# Patient Record
Sex: Female | Born: 1965 | Race: White | Hispanic: No | Smoking: Never smoker
Health system: Southern US, Community
[De-identification: ages and names within clinical notes are randomized; demographics above are authoritative.]

## PROBLEM LIST (undated history)

## (undated) DIAGNOSIS — K219 Gastro-esophageal reflux disease without esophagitis: Secondary | ICD-10-CM

## (undated) DIAGNOSIS — E079 Disorder of thyroid, unspecified: Secondary | ICD-10-CM

## (undated) DIAGNOSIS — F419 Anxiety disorder, unspecified: Secondary | ICD-10-CM

## (undated) DIAGNOSIS — I10 Essential (primary) hypertension: Secondary | ICD-10-CM

## (undated) HISTORY — PX: TONSILLECTOMY: SUR1361

## (undated) HISTORY — PX: ABDOMINAL HYSTERECTOMY: SHX81

---

## 2005-07-05 ENCOUNTER — Ambulatory Visit: Payer: Self-pay | Admitting: Internal Medicine

## 2005-08-24 ENCOUNTER — Ambulatory Visit: Payer: Self-pay | Admitting: Gastroenterology

## 2005-09-14 ENCOUNTER — Ambulatory Visit: Payer: Self-pay | Admitting: Gastroenterology

## 2006-02-01 ENCOUNTER — Ambulatory Visit: Payer: Self-pay | Admitting: Gastroenterology

## 2006-03-26 ENCOUNTER — Emergency Department: Payer: Self-pay | Admitting: Emergency Medicine

## 2006-06-21 ENCOUNTER — Ambulatory Visit: Payer: Self-pay | Admitting: Otolaryngology

## 2007-06-03 ENCOUNTER — Ambulatory Visit: Payer: Self-pay | Admitting: General Surgery

## 2008-07-09 ENCOUNTER — Ambulatory Visit: Payer: Self-pay | Admitting: Unknown Physician Specialty

## 2008-08-02 ENCOUNTER — Ambulatory Visit: Payer: Self-pay | Admitting: Unknown Physician Specialty

## 2008-08-12 ENCOUNTER — Ambulatory Visit: Payer: Self-pay | Admitting: Unknown Physician Specialty

## 2009-05-22 ENCOUNTER — Emergency Department: Payer: Self-pay | Admitting: Emergency Medicine

## 2009-06-03 ENCOUNTER — Ambulatory Visit: Payer: Self-pay | Admitting: General Surgery

## 2010-11-15 ENCOUNTER — Ambulatory Visit: Payer: Self-pay | Admitting: Internal Medicine

## 2012-09-16 ENCOUNTER — Ambulatory Visit: Payer: Self-pay | Admitting: Otolaryngology

## 2012-09-16 LAB — T4, FREE: Free Thyroxine: 1.07 ng/dL (ref 0.76–1.46)

## 2012-09-16 LAB — TSH: Thyroid Stimulating Horm: 3.95 u[IU]/mL

## 2012-09-23 ENCOUNTER — Ambulatory Visit: Payer: Self-pay | Admitting: Otolaryngology

## 2013-02-26 ENCOUNTER — Ambulatory Visit: Payer: Self-pay | Admitting: Otolaryngology

## 2013-03-04 ENCOUNTER — Observation Stay: Payer: Self-pay | Admitting: Otolaryngology

## 2013-03-04 LAB — COMPREHENSIVE METABOLIC PANEL
Albumin: 3.9 g/dL (ref 3.4–5.0)
Anion Gap: 7 (ref 7–16)
BUN: 15 mg/dL (ref 7–18)
Bilirubin,Total: 0.4 mg/dL (ref 0.2–1.0)
Calcium, Total: 9.2 mg/dL (ref 8.5–10.1)
Chloride: 102 mmol/L (ref 98–107)
Co2: 28 mmol/L (ref 21–32)
EGFR (African American): >60
Potassium: 3.4 mmol/L — ABNORMAL LOW (ref 3.5–5.1)
SGPT (ALT): 24 U/L (ref 12–78)
Sodium: 137 mmol/L (ref 136–145)
Total Protein: 8.3 g/dL — ABNORMAL HIGH (ref 6.4–8.2)

## 2013-03-04 LAB — CBC WITH DIFFERENTIAL/PLATELET
Basophil #: 0 10*3/uL (ref 0.0–0.1)
Basophil %: 0.3 %
Eosinophil %: 0.1 %
HCT: 43.4 % (ref 35.0–47.0)
Lymphocyte %: 30.9 %
MCH: 28.6 pg (ref 26.0–34.0)
MCHC: 33.5 g/dL (ref 32.0–36.0)
MCV: 86 fL (ref 80–100)
Monocyte #: 1.1 x10 3/mm — ABNORMAL HIGH (ref 0.2–0.9)
Monocyte %: 6.8 %
RBC: 5.07 10*6/uL (ref 3.80–5.20)
RDW: 12.9 % (ref 11.5–14.5)
WBC: 16.5 10*3/uL — ABNORMAL HIGH (ref 3.6–11.0)

## 2013-03-04 LAB — PROTIME-INR: Prothrombin Time: 13.3 secs (ref 11.5–14.7)

## 2015-04-01 NOTE — Discharge Summary (Signed)
Dates of Admission and Diagnosis:  Date of Admission 04-Mar-2013   Date of Discharge 04-Mar-2013   Admitting Diagnosis Post-op Tonsil Hemorrhage   Final Diagnosis Post-op Tonsil Hemorrhage   Discharge Diagnosis 1 Post-op Tonsil Hemorrhage    Chief Complaint/History of Present Illness 49 y.o. female s/p tonsillectomy and septoplasty 5 days ago presented to ED with significant emesis and bleeding from oral cavity.  Hgb in ED stable at >14 and coaguation normal.   Hepatic:  26-Mar-14 02:32   Bilirubin, Total 0.4  Alkaline Phosphatase 131  SGPT (ALT) 24  SGOT (AST)  13  Total Protein, Serum  8.3  Albumin, Serum 3.9  Routine BB:  26-Mar-14 02:32   ABO Group + Rh Type O Positive  Antibody Screen NEGATIVE (Result(s) reported on 04 Mar 2013 at 03:55AM.)  Routine Chem:  26-Mar-14 02:32   Glucose, Serum  134  BUN 15  Creatinine (comp) 1.04  Sodium, Serum 137  Potassium, Serum  3.4  Chloride, Serum 102  CO2, Serum 28  Calcium (Total), Serum 9.2  Osmolality (calc) 277  eGFR (African American) >60  eGFR (Non-African American) >60 (eGFR values <7m/min/1.73 m2 may be an indication of chronic kidney disease (CKD). Calculated eGFR is useful in patients with stable renal function. The eGFR calculation will not be reliable in acutely ill patients when serum creatinine is changing rapidly. It is not useful in  patients on dialysis. The eGFR calculation may not be applicable to patients at the low and high extremes of body sizes, pregnant women, and vegetarians.)  Anion Gap 7  Routine Coag:  26-Mar-14 02:32   Prothrombin 13.3  INR 1.0 (INR reference interval applies to patients on anticoagulant therapy. A single INR therapeutic range for coumarins is not optimal for all indications; however, the suggested range for most indications is 2.0 - 3.0. Exceptions to the INR Reference Range may include: Prosthetic heart valves, acute myocardial infarction, prevention of  myocardial infarction, and combinations of aspirin and anticoagulant. The need for a higher or lower target INR must be assessed individually. Reference: The Pharmacology and Management of the Vitamin K  antagonists: the seventh ACCP Conference on Antithrombotic and Thrombolytic Therapy. CYIRSW.5462Sept:126 (3suppl): [id number] A HCT value >55% may artifactually increase the PT.  In one study,  the increase was an average of 25%. Reference:  "Effect on Routine and Special Coagulation Testing Values of Citrate Anticoagulant Adjustment in Patients with High HCT Values." American Journal of Clinical Pathology 2006;126:400-405.)  Routine Hem:  26-Mar-14 02:32   WBC (CBC)  16.5  RBC (CBC) 5.07  Hemoglobin (CBC) 14.5  Hematocrit (CBC) 43.4  Platelet Count (CBC) 440  MCV 86  MCH 28.6  MCHC 33.5  RDW 12.9  Neutrophil % 61.9  Lymphocyte % 30.9  Monocyte % 6.8  Eosinophil % 0.1  Basophil % 0.3  Neutrophil #  10.2  Lymphocyte #  5.1  Monocyte #  1.1  Eosinophil # 0.0  Basophil # 0.0 (Result(s) [facility] following uneventful control of hemorrhage from right inferior tonsillar fossa.  Tolerated diet well.  Placed on Nucynta and tolerated it well with good pain control and no nausea/vomitting.  Ambulating to bathroom.  No bleeding.   Condition on Discharge Good   DISCHARGE INSTRUCTIONS HOME MEDS:  Medication Reconciliation: Patient's Home Medications at Discharge:     Medication Instructions  nasonex spray  1 spray(s)   as needed  zyrtec 10 mg oral tablet  1 tab(s)  once a day as needed     tylenol 559m  2 tab(s)  2 times a day as needed     omeprazole/magnesium    once a day   centrum vitamin    once a day   tapentadol 50 mg oral tablet  1 tab(s) orally every 4 hours, As needed, pain   promethazine  25 milligram(s) PO every 4 hours, As Needed, nausea, vomiting , As needed, nausea, vomiting     PRESCRIPTIONS: PRINTED AND PLACED ON CHART   Physician's Instructions:  Home Health? No   Treatments None   Home Oxygen? No   Diet Regular   Diet Consistency Mechanical Soft   Activity Limitations No exertional activity  No heavy lifting   Return to Work after follow up visit with MD   Time frame for Follow Up Appointment 1-2 weeks  Dr. JKathyrn Sheriffin 1 week   Electronic Signatures: Evelyn Aguinaldo, CShela Leff(MD)  (Signed 26-Mar-14 11:43)  Authored: ADMISSION DATE AND DIAGNOSIS, CHIEF COMPLAINT/HPI, [place], PATIENT INSTRUCTIONS   Last Updated: 26-Mar-14 11:43 by VPascal Lux(MD)

## 2015-04-01 NOTE — Op Note (Signed)
PATIENT NAME:  Suzanne Cox, Suzanne Cox MR#:  [id number] DATE OF BIRTH:  03-Jul-1966  DATE OF PROCEDURE:  03/04/2013  PREOPERATIVE DIAGNOSIS:  Postoperative tonsillar hemorrhage.   POSTOPERATIVE DIAGNOSES:  Postoperative tonsillar hemorrhage.   PROCEDURE PERFORMED:   1.  Controlled postoperative nasal hemorrhage.  2.  Nasal endoscopy.   SURGEON:  Kyung Rudd, M.D.   ANESTHESIA:  General endotracheal anesthesia.   ESTIMATED BLOOD LOSS:  3 mL.   IV FLUIDS:  Please see anesthesia record.   COMPLICATIONS:  None.   DRAINS AND STENT PLACEMENTS:  None.   SPECIMENS:  None.   INDICATIONS FOR PROCEDURE:  The patient is a 49 year old female 5 days status post a tonsillectomy as well as a septoplasty with an episode of profuse bleeding from her oral cavity.  I was called to evaluate in the Emergency Room.  She had a large blood clot at a right inferior pole.  She also is status post a septoplasty and had septal splints in place.  The patient was taken to the operating room for control of postoperative tonsillar hemorrhage as well as endoscopic nasal evaluation, possible cauterization.   OPERATIVE FINDINGS:  Right lower pole bleeding arterial vessel successfully cauterized, other small areas of friability.  The posterior oropharynx and tonsillar beds were cauterized.  Nasal endoscopy revealed the status post submucous resection and septoplasty, but no obvious signs of bleeding intranasally or in the nasopharynx.   DESCRIPTION OF PROCEDURE:  The patient was identified in holding.  The benefits and risks of procedure were discussed and consent was reviewed.  The patient was taken to the operating room and placed in the supine position.  General endotracheal anesthesia was induced.  The patient was rotated 45 degrees.  A shoulder roll was placed.  A McIvor mouth gag was inserted in patient's oral cavity and suspended from a Mayo stand.  The patient's oral cavity was copiously irrigated with sterile  saline.  This demonstrated a fibrinous exudate on her tonsils on the right side and tonsillar bed at the inferior pole on the right.  There was a blood clot and this was removed with Bovie suction cautery and there was brisk arterial bleeding.  At this point an Afrin-soaked pledgets was held in place for hemostasis and then Bovie suction cautery was applied to the inferior pole and the surrounding areas.  Manipulation of the other fibrinous exudate revealed some friability more at the superior aspect of the right pole and this area was cauterized.  There is a visible vein on the left side and this was cauterized as well.  At this time the patient's McIvor mouth gag was released from suspension and nasal endoscopy was performed.  A 0 degree endoscope was inserted into the patient's right nasal cavity and advanced.  A small blood clot was noted in the patient's inferior septum.  This was removed with a rhinologic suction.  There was a splint in place.  This was clipped and the splint was removed bilaterally after the suture was removed and the 0 degree endoscope was passed all the way to the posterior nasopharynx.  No evidence of any bleeding was admitted.  Attention was directed to patient's left side.  In a similar fashion the left side was evaluated endoscopically.  Again showed a submucous resection of a turbinate, but no evidence of active bleeding.  Small area of crusting was removed and visualization all the way back to the patient's nasopharynx was made with no issues.   At this time,  attention was directed again to the patient's oral cavity.  The mouth gag was placed into the patient's oral cavity and placed in suspension.  Visualization of the patient's tonsillar beds revealed a good hemostasis with no evidence of active bleeding and an OG tube was placed through the oral cavity site and through her oral cavity, passed down to the stomach and some trace amount of blood-tinged secretions were removed.  At  this time care of the patient was transferred to anesthesia and the patient tolerated the procedure well.      ____________________________ Kyung Ruddreighton C. Onia Shiflett, MD ccv:ea D: 03/04/2013 04:25:56 ET T: 03/04/2013 06:26:59 ET JOB#: [id number]  cc: Kyung Ruddreighton C. Mayzie Caughlin, MD, <Dictator> Kyung RuddREIGHTON C Ilya Neely MD ELECTRONICALLY SIGNED 03/06/2013 17:50

## 2015-09-29 ENCOUNTER — Emergency Department
Admission: EM | Admit: 2015-09-29 | Discharge: 2015-09-29 | Disposition: A | Discharge status: Home / Self Care | Payer: No Typology Code available for payment source | Attending: Emergency Medicine | Admitting: Emergency Medicine

## 2015-09-29 ENCOUNTER — Emergency Department: Payer: No Typology Code available for payment source

## 2015-09-29 DIAGNOSIS — R1011 Right upper quadrant pain: Secondary | ICD-10-CM

## 2015-09-29 DIAGNOSIS — G43809 Other migraine, not intractable, without status migrainosus: Secondary | ICD-10-CM | POA: Insufficient documentation

## 2015-09-29 DIAGNOSIS — M549 Dorsalgia, unspecified: Secondary | ICD-10-CM | POA: Insufficient documentation

## 2015-09-29 DIAGNOSIS — R1013 Epigastric pain: Secondary | ICD-10-CM | POA: Diagnosis not present

## 2015-09-29 DIAGNOSIS — K5900 Constipation, unspecified: Secondary | ICD-10-CM | POA: Insufficient documentation

## 2015-09-29 DIAGNOSIS — Z79899 Other long term (current) drug therapy: Secondary | ICD-10-CM | POA: Diagnosis not present

## 2015-09-29 DIAGNOSIS — R110 Nausea: Secondary | ICD-10-CM

## 2015-09-29 LAB — URINALYSIS COMPLETE WITH MICROSCOPIC (ARMC ONLY)
Bilirubin Urine: NEGATIVE
Glucose, UA: NEGATIVE mg/dL
Hgb urine dipstick: NEGATIVE
Ketones, ur: NEGATIVE mg/dL
LEUKOCYTES UA: NEGATIVE
Nitrite: NEGATIVE
PH: 7 (ref 5.0–8.0)
PROTEIN: NEGATIVE mg/dL
Specific Gravity, Urine: 1.002 — ABNORMAL LOW (ref 1.005–1.030)

## 2015-09-29 LAB — CBC
HEMATOCRIT: 41.8 % (ref 35.0–47.0)
Hemoglobin: 14.2 g/dL (ref 12.0–16.0)
MCH: 29.3 pg (ref 26.0–34.0)
MCHC: 34 g/dL (ref 32.0–36.0)
MCV: 86.3 fL (ref 80.0–100.0)
Platelets: 252 10*3/uL (ref 150–440)
RBC: 4.84 MIL/uL (ref 3.80–5.20)
RDW: 13 % (ref 11.5–14.5)
WBC: 8.7 10*3/uL (ref 3.6–11.0)

## 2015-09-29 LAB — COMPREHENSIVE METABOLIC PANEL
ALT: 24 U/L (ref 14–54)
AST: 16 U/L (ref 15–41)
Albumin: 4.1 g/dL (ref 3.5–5.0)
Alkaline Phosphatase: 108 U/L (ref 38–126)
Anion gap: 9 (ref 5–15)
BUN: 10 mg/dL (ref 6–20)
CHLORIDE: 104 mmol/L (ref 101–111)
CO2: 27 mmol/L (ref 22–32)
Calcium: 9.1 mg/dL (ref 8.9–10.3)
Creatinine, Ser: 0.87 mg/dL (ref 0.44–1.00)
GFR calc Af Amer: >60 mL/min (ref >60)
Glucose, Bld: 92 mg/dL (ref 65–99)
POTASSIUM: 3.5 mmol/L (ref 3.5–5.1)
SODIUM: 140 mmol/L (ref 135–145)
Total Bilirubin: 0.3 mg/dL (ref 0.3–1.2)
Total Protein: 7.5 g/dL (ref 6.5–8.1)

## 2015-09-29 LAB — LIPASE, BLOOD: LIPASE: 20 U/L (ref 11–51)

## 2015-09-29 MED ORDER — OXYCODONE-ACETAMINOPHEN 5-325 MG PO TABS: 1.0000 | ORAL_TABLET | Freq: Four times a day (QID) | ORAL | Status: AC | PRN

## 2015-09-29 MED ORDER — ONDANSETRON HCL 4 MG PO TABS: 4.0000 mg | ORAL_TABLET | Freq: Three times a day (TID) | ORAL | Status: AC | PRN

## 2015-09-29 MED ORDER — HYDROMORPHONE HCL 1 MG/ML IJ SOLN: 0.5000 mg | Freq: Once | INTRAMUSCULAR | Status: DC

## 2015-09-29 MED ORDER — SODIUM CHLORIDE 0.9 % IV BOLUS (SEPSIS)
1000.0000 mL | Freq: Once | INTRAVENOUS | Status: AC
  Administered 2015-09-29: 1000 mL via INTRAVENOUS

## 2015-09-29 MED ORDER — HYDROMORPHONE HCL 1 MG/ML IJ SOLN
1.0000 mg | Freq: Once | INTRAMUSCULAR | Status: AC
  Administered 2015-09-29: 1 mg via INTRAVENOUS
  Filled 2015-09-29: qty 1

## 2015-09-29 MED ORDER — FENTANYL CITRATE (PF) 100 MCG/2ML IJ SOLN: 25.0000 ug | Freq: Once | INTRAMUSCULAR | Status: DC

## 2015-09-29 MED ORDER — OMEPRAZOLE 40 MG PO CPDR: 40.0000 mg | DELAYED_RELEASE_CAPSULE | Freq: Every day | ORAL | Status: DC

## 2015-09-29 MED ORDER — ONDANSETRON HCL 4 MG/2ML IJ SOLN
4.0000 mg | Freq: Once | INTRAMUSCULAR | Status: AC
  Administered 2015-09-29: 4 mg via INTRAVENOUS
  Filled 2015-09-29: qty 2

## 2015-09-29 NOTE — ED Notes (Signed)
Pt to triage with reports of abd pain, back pain and headache x 3 days. Reports nausea without vomiting, denies diarrhea. Also reports some constipation but took enema today and had BM.

## 2015-09-29 NOTE — ED Provider Notes (Signed)
[facility] Emergency Department Provider Note  ____________________________________________  Time seen: Approximately 3:44 PM  I have reviewed the triage vital signs and the nursing notes.   HISTORY  Chief Complaint Abdominal Pain; Back Pain; and Headache    HPI Suzanne Cox is a 49 y.o. female with a family history of gallbladder disease presenting with epigastric and right upper quadrant pain, as well as migraine. The patient reports that 3 days ago she had a typical mild migraine. Yesterday she woke up and was also having a migraine but then developed epigastric and right upper quadrant "sharp" pains after eating. She did not eat any more for the rest the day. This morning she had breakfast, and the same pains recurred. She denies any fever, chills, diarrhea. She has had some constipation but has had 2 good bowel movements with laxatives today.She reports that at this time, her headache is "mild."   No past medical history on file.  There are no active problems to display for this patient.   No past surgical history on file.  Current Outpatient Rx  Name  Route  Sig  Dispense  Refill  . butalbital-acetaminophen-caffeine (FIORICET, ESGIC) 50-325-40 MG tablet   Oral   Take 1 tablet by mouth every 4 (four) hours as needed.      0   . Cetirizine HCl 10 MG CAPS   Oral   Take 1 tablet by mouth daily at 12 noon.         . escitalopram (LEXAPRO) 10 MG tablet   Oral   Take 1 tablet by mouth daily at 12 noon.      0   . NASONEX 50 MCG/ACT nasal spray   Nasal   Place 2 sprays into the nose daily as needed.      0     Dispense as written.   Marland Kitchen omeprazole (PRILOSEC) 40 MG capsule   Oral   Take 1 capsule (40 mg total) by mouth daily.   30 capsule   0   . ondansetron (ZOFRAN) 4 MG tablet   Oral   Take 1 tablet (4 mg total) by mouth every 8 (eight) hours as needed for nausea or vomiting.   15 tablet   0   . oxyCODONE-acetaminophen  (ROXICET) 5-325 MG tablet   Oral   Take 1 tablet by mouth every 6 (six) hours as needed.   6 tablet   0     Allergies Theophyllines; Topamax; Sulfa antibiotics; and Tetracyclines & related  No family history on file.  Social History Social History  Substance Use Topics  . Smoking status: Not on file  . Smokeless tobacco: Not on file  . Alcohol Use: Not on file    Review of Systems Constitutional: No fever/chills. No lightheadedness. No syncope. Eyes: No visual changes. ENT: No sore throat.  Cardiovascular: Denies chest pain, palpitations. Respiratory: Denies shortness of breath.  No cough. Gastrointestinal: Positive epigastric and right upper quadrant abdominal pain.  Positive nausea, no vomiting.  No diarrhea.  Positive constipation. Genitourinary: Negative for dysuria. No change in frequency. Musculoskeletal: Negative for back pain. Skin: Negative for rash. Neurological: Negative for headaches, focal weakness or numbness.  10-point ROS otherwise negative.  ____________________________________________   PHYSICAL EXAM:  VITAL SIGNS: ED Triage Vitals  Enc Vitals Group     BP 09/29/15 1530 153/91 mmHg     Pulse Rate 09/29/15 1530 88     Resp 09/29/15 1530 18     Temp 09/29/15  1530 98.4 F (36.9 C)     Temp Source 09/29/15 1530 Oral     SpO2 09/29/15 1530 96 %     Weight 09/29/15 1530 186 lb (84.369 kg)     Height 09/29/15 1530  (1.575 m)     Head Cir --      Peak Flow --      Pain Score 09/29/15 1530 5     Pain Loc --      Pain Edu? --      Excl. in GC? --     Constitutional: Alert and oriented. Well appearing and in no acute distress. Answer question appropriately. Eyes: Conjunctivae are normal.  EOMI. Head: Atraumatic. Nose: No congestion/rhinnorhea. Mouth/Throat: Mucous membranes are moist.  Neck: No stridor.  Supple.   Cardiovascular: Normal rate, regular rhythm. No murmurs, rubs or gallops.  Respiratory: Normal respiratory effort.  No  retractions. Lungs CTAB.  No wheezes, rales or ronchi. Gastrointestinal: Overweight, soft, nondistended. Tender to palpation in the right upper quadrant greater than epigastric areas with positive Murphy sign. No peritoneal signs, guarding or  rebound. Musculoskeletal: No LE edema.  Neurologic:  Normal speech and language. No gross focal neurologic deficits are appreciated.  Skin:  Skin is warm, dry and intact. No rash noted. Psychiatric: Mood and affect are normal. Speech and behavior are normal.  Normal judgement  ____________________________________________   LABS (all labs ordered are listed, but only abnormal results are displayed)  Labs Reviewed  LIPASE, BLOOD  COMPREHENSIVE METABOLIC PANEL  CBC  URINALYSIS COMPLETEWITH MICROSCOPIC ([facility] ONLY)   ____________________________________________  EKG  Not indicated ____________________________________________  RADIOLOGY  US Abdomen Limited Ruq  09/29/2015  CLINICAL DATA:  Right upper quadrant abdominal pain for 3 days with nausea. EXAM: US ABDOMEN LIMITED - RIGHT UPPER QUADRANT COMPARISON:  06/03/2007 abdominal pelvic CT. FINDINGS: Gallbladder: No gallstones or wall thickening visualized. No sonographic Murphy sign noted. Common bile duct: Diameter: Normal, 4 mm. Liver: No focal lesion identified. Within normal limits in parenchymal echogenicity. IMPRESSION: Normal right upper quadrant ultrasound.  No explanation for pain. Electronically Signed   By: Jeronimo Greaves M.D.   On: 09/29/2015 16:46    ____________________________________________   PROCEDURES  Procedure(s) performed: None  Critical Care performed: No ____________________________________________   INITIAL IMPRESSION / ASSESSMENT AND PLAN / ED COURSE  Pertinent labs & imaging results that were available during my care of the patient were reviewed by me and considered in my medical decision making (see chart for details).  49 y.o. female presenting with  intermittent epigastric and right upper quadrant sharp pains associated with nausea that are worse with eating. Given her exam, I am most concerned about gallbladder disease. At this time she has no symptoms that would be consistent with infection but we will check her white blood cell count and continue to monitor her temperature. I'll plan to send her for right upper quadrant ultrasound. Initiate symptomatically treatment.   ----------------------------------------- 4:16 PM on 09/29/2015 -----------------------------------------  The patient pain has improved and her nausea also has improved. I'm awaiting her ultrasound. She does have labs that are reassuring with a normal white count and LFTs which are not elevated.  ----------------------------------------- 5:05 PM on 09/29/2015 -----------------------------------------  The patient's symptoms have completely resolved, she is no longer having pain or nausea. Her ultrasound does not show gallbladder disease. Her labs are reassuring and her vital signs are stable. She does have a history of GERD and this may be an exacerbation of that so I  have increased her omeprazole from 20 mg daily to 40 mg daily. She will follow up with her primary care physician. She understands return precautions and follow-up instructions. ____________________________________________  FINAL CLINICAL IMPRESSION(S) / ED DIAGNOSES  Final diagnoses:  Right upper quadrant pain  Epigastric pain  Nausea  Other migraine without status migrainosus, not intractable      NEW MEDICATIONS STARTED DURING THIS VISIT:  New Prescriptions   OMEPRAZOLE (PRILOSEC) 40 MG CAPSULE    Take 1 capsule (40 mg total) by mouth daily.   ONDANSETRON (ZOFRAN) 4 MG TABLET    Take 1 tablet (4 mg total) by mouth every 8 (eight) hours as needed for nausea or vomiting.   OXYCODONE-ACETAMINOPHEN (ROXICET) 5-325 MG TABLET    Take 1 tablet by mouth every 6 (six) hours as needed.      Rockne MenghiniAnne-Caroline Santa Abdelrahman, MD 09/29/15 1706

## 2015-09-29 NOTE — Discharge Instructions (Signed)
Please drink plenty of fluid to stay well hydrated. You may take a clear liquid diet for the next 12-24 hours, then advance her diet as tolerated. He may take Zofran for nausea. He may take Motrin or Tylenol for mild to moderate pain, and Percocet for severe pain. Do not drive within 8 hours of taking Percocet.  Please return to the emergency department if he develops severe pain, fever, inability to keep down fluids, or any other symptoms concerning to you.  Abdominal Pain, Adult Many things can cause abdominal pain. Usually, abdominal pain is not caused by a disease and will improve without treatment. It can often be observed and treated at home. Your health care provider will do a physical exam and possibly order blood tests and X-rays to help determine the seriousness of your pain. However, in many cases, more time must pass before a clear cause of the pain can be found. Before that point, your health care provider may not know if you need more testing or further treatment. HOME CARE INSTRUCTIONS Monitor your abdominal pain for any changes. The following actions may help to alleviate any discomfort you are experiencing:  Only take over-the-counter or prescription medicines as directed by your health care provider.  Do not take laxatives unless directed to do so by your health care provider.  Try a clear liquid diet (broth, tea, or water) as directed by your health care provider. Slowly move to a bland diet as tolerated. SEEK MEDICAL CARE IF:  You have unexplained abdominal pain.  You have abdominal pain associated with nausea or diarrhea.  You have pain when you urinate or have a bowel movement.  You experience abdominal pain that wakes you in the night.  You have abdominal pain that is worsened or improved by eating food.  You have abdominal pain that is worsened with eating fatty foods.  You have a fever. SEEK IMMEDIATE MEDICAL CARE IF:  Your pain does not go away within 2  hours.  You keep throwing up (vomiting).  Your pain is felt only in portions of the abdomen, such as the right side or the left lower portion of the abdomen.  You pass bloody or black tarry stools. MAKE SURE YOU:  Understand these instructions.  Will watch your condition.  Will get help right away if you are not doing well or get worse.   This information is not intended to replace advice given to you by your health care provider. Make sure you discuss any questions you have with your health care provider.   Document Released: 09/05/2005 Document Revised: 08/17/2015 Document Reviewed: 08/05/2013 Elsevier Interactive Patient Education [employer]2016 [employer] Inc.

## 2015-09-30 ENCOUNTER — Ambulatory Visit
Admission: RE | Admit: 2015-09-30 | Discharge: 2015-09-30 | Disposition: A | Discharge status: Home / Self Care | Payer: No Typology Code available for payment source | Source: Ambulatory Visit | Attending: Family Medicine | Admitting: Family Medicine

## 2015-09-30 ENCOUNTER — Ambulatory Visit
Admission: RE | Admit: 2015-09-30 | Payer: No Typology Code available for payment source | Source: Ambulatory Visit | Admitting: *Deleted

## 2015-09-30 ENCOUNTER — Other Ambulatory Visit: Payer: Self-pay | Admitting: Family Medicine

## 2015-09-30 DIAGNOSIS — R1011 Right upper quadrant pain: Secondary | ICD-10-CM | POA: Insufficient documentation

## 2015-11-16 ENCOUNTER — Other Ambulatory Visit: Payer: Self-pay | Admitting: Internal Medicine

## 2015-11-16 DIAGNOSIS — E0590 Thyrotoxicosis, unspecified without thyrotoxic crisis or storm: Secondary | ICD-10-CM

## 2015-11-28 ENCOUNTER — Encounter
Admission: RE | Admit: 2015-11-28 | Discharge: 2015-11-28 | Disposition: A | Discharge status: Home / Self Care | Payer: No Typology Code available for payment source | Source: Ambulatory Visit | Attending: Internal Medicine | Admitting: Internal Medicine

## 2015-11-28 DIAGNOSIS — E0590 Thyrotoxicosis, unspecified without thyrotoxic crisis or storm: Secondary | ICD-10-CM | POA: Insufficient documentation

## 2015-11-28 MED ORDER — SODIUM IODIDE I-123 7.4 MBQ PO CAPS
159.6730 | ORAL_CAPSULE | Freq: Once | ORAL | Status: AC
  Administered 2015-11-28: 159.673 via ORAL
  Filled 2015-11-28: qty 1

## 2015-11-29 ENCOUNTER — Encounter
Admission: RE | Admit: 2015-11-29 | Discharge: 2015-11-29 | Disposition: A | Discharge status: Home / Self Care | Payer: No Typology Code available for payment source | Source: Ambulatory Visit | Attending: Internal Medicine | Admitting: Internal Medicine

## 2015-11-29 DIAGNOSIS — E0590 Thyrotoxicosis, unspecified without thyrotoxic crisis or storm: Secondary | ICD-10-CM | POA: Diagnosis not present

## 2017-08-03 ENCOUNTER — Encounter: Payer: Self-pay | Admitting: Emergency Medicine

## 2017-08-03 ENCOUNTER — Emergency Department
Admission: EM | Admit: 2017-08-03 | Discharge: 2017-08-03 | Disposition: A | Discharge status: Home / Self Care | Payer: No Typology Code available for payment source | Attending: Emergency Medicine | Admitting: Emergency Medicine

## 2017-08-03 ENCOUNTER — Emergency Department: Payer: No Typology Code available for payment source

## 2017-08-03 DIAGNOSIS — Z79899 Other long term (current) drug therapy: Secondary | ICD-10-CM | POA: Diagnosis not present

## 2017-08-03 DIAGNOSIS — K5733 Diverticulitis of large intestine without perforation or abscess with bleeding: Secondary | ICD-10-CM | POA: Diagnosis not present

## 2017-08-03 DIAGNOSIS — R109 Unspecified abdominal pain: Secondary | ICD-10-CM | POA: Diagnosis present

## 2017-08-03 DIAGNOSIS — K5792 Diverticulitis of intestine, part unspecified, without perforation or abscess without bleeding: Secondary | ICD-10-CM

## 2017-08-03 LAB — CBC
HEMATOCRIT: 41.3 % (ref 35.0–47.0)
HEMOGLOBIN: 14.2 g/dL (ref 12.0–16.0)
MCH: 29.6 pg (ref 26.0–34.0)
MCHC: 34.2 g/dL (ref 32.0–36.0)
MCV: 86.3 fL (ref 80.0–100.0)
Platelets: 264 10*3/uL (ref 150–440)
RBC: 4.79 MIL/uL (ref 3.80–5.20)
RDW: 12.7 % (ref 11.5–14.5)
WBC: 11.3 10*3/uL — ABNORMAL HIGH (ref 3.6–11.0)

## 2017-08-03 LAB — COMPREHENSIVE METABOLIC PANEL
ALBUMIN: 4.3 g/dL (ref 3.5–5.0)
ALT: 34 U/L (ref 14–54)
ANION GAP: 10 (ref 5–15)
AST: 21 U/L (ref 15–41)
Alkaline Phosphatase: 129 U/L — ABNORMAL HIGH (ref 38–126)
BUN: 17 mg/dL (ref 6–20)
CO2: 26 mmol/L (ref 22–32)
Calcium: 9.2 mg/dL (ref 8.9–10.3)
Chloride: 104 mmol/L (ref 101–111)
Creatinine, Ser: 0.93 mg/dL (ref 0.44–1.00)
GFR calc Af Amer: >60 mL/min (ref >60)
GFR calc non Af Amer: >60 mL/min (ref >60)
GLUCOSE: 132 mg/dL — AB (ref 65–99)
POTASSIUM: 3.4 mmol/L — AB (ref 3.5–5.1)
SODIUM: 140 mmol/L (ref 135–145)
Total Bilirubin: 0.5 mg/dL (ref 0.3–1.2)
Total Protein: 7.8 g/dL (ref 6.5–8.1)

## 2017-08-03 LAB — URINALYSIS, COMPLETE (UACMP) WITH MICROSCOPIC
Bilirubin Urine: NEGATIVE
Glucose, UA: NEGATIVE mg/dL
HGB URINE DIPSTICK: NEGATIVE
Ketones, ur: NEGATIVE mg/dL
Leukocytes, UA: NEGATIVE
Nitrite: NEGATIVE
PROTEIN: NEGATIVE mg/dL
RBC / HPF: NONE SEEN RBC/hpf (ref 0–5)
Specific Gravity, Urine: 1.009 (ref 1.005–1.030)
pH: 6 (ref 5.0–8.0)

## 2017-08-03 LAB — LIPASE, BLOOD: Lipase: 26 U/L (ref 11–51)

## 2017-08-03 MED ORDER — TRAMADOL HCL 50 MG PO TABS: 50.0000 mg | ORAL_TABLET | Freq: Four times a day (QID) | ORAL | 0 refills | Status: AC | PRN

## 2017-08-03 MED ORDER — CIPROFLOXACIN HCL 500 MG PO TABS: 500.0000 mg | ORAL_TABLET | Freq: Two times a day (BID) | ORAL | 0 refills | Status: AC

## 2017-08-03 MED ORDER — ONDANSETRON HCL 4 MG PO TABS: 4.0000 mg | ORAL_TABLET | Freq: Three times a day (TID) | ORAL | 0 refills | Status: DC | PRN

## 2017-08-03 MED ORDER — METRONIDAZOLE 500 MG PO TABS: 500.0000 mg | ORAL_TABLET | Freq: Three times a day (TID) | ORAL | 0 refills | Status: AC

## 2017-08-03 MED ORDER — IOPAMIDOL (ISOVUE-300) INJECTION 61%
100.0000 mL | Freq: Once | INTRAVENOUS | Status: AC | PRN
  Administered 2017-08-03: 100 mL via INTRAVENOUS

## 2017-08-03 MED ORDER — IOPAMIDOL (ISOVUE-300) INJECTION 61%
30.0000 mL | Freq: Once | INTRAVENOUS | Status: AC | PRN
  Administered 2017-08-03: 30 mL via ORAL

## 2017-08-03 NOTE — ED Triage Notes (Signed)
Pt to ed with c/o left sided abd pain acute onset this am. Denies n/v/d.

## 2017-08-03 NOTE — Discharge Instructions (Signed)
Please seek medical attention for any high fevers, chest pain, shortness of breath, change in behavior, persistent vomiting, bloody stool or any other new or concerning symptoms.  

## 2017-08-03 NOTE — ED Notes (Signed)
Patient has finished the oral contrast. CT scan aware.

## 2017-08-03 NOTE — ED Provider Notes (Signed)
[facility] Emergency Department Provider Note    ____________________________________________   I have reviewed the triage vital signs and the nursing notes.   HISTORY  Chief Complaint Abdominal Pain   History limited by: Not Limited   HPI Suzanne Cox is a 51 y.o. female who presents to the emergency department today because of concerns for abdominal pain. The pain started this morning. It was severe. It has been continuous since then. It is located in the lower abdomen primarily on the left side. She has had some difficulty with defecation with this pain. She denies any blood in her stool. No measured fevers.patient states it reminds her of when she has had ovarian cysts however she is undergone bilateral oophorectomy.    History reviewed. No pertinent past medical history.  There are no active problems to display for this patient.   History reviewed. No pertinent surgical history.  Prior to Admission medications   Medication Sig Start Date End Date Taking? Authorizing Provider  butalbital-acetaminophen-caffeine (FIORICET, ESGIC) 50-325-40 MG tablet Take 1 tablet by mouth every 4 (four) hours as needed. 07/04/15   [provider]  Cetirizine HCl 10 MG CAPS Take 1 tablet by mouth daily at 12 noon.    [provider]  escitalopram (LEXAPRO) 10 MG tablet Take 1 tablet by mouth daily at 12 noon. 09/16/15   [provider]  NASONEX 50 MCG/ACT nasal spray Place 2 sprays into the nose daily as needed. 07/07/15   [provider]  omeprazole (PRILOSEC) 40 MG capsule Take 1 capsule (40 mg total) by mouth daily. 09/29/15 09/28/16  Rockne Menghini, MD    Allergies Theophyllines; Topamax [topiramate]; Sulfa antibiotics; and Tetracyclines & related  History reviewed. No pertinent family history.  Social History Social History  Substance Use Topics  . Smoking status: Never Smoker  . Smokeless tobacco: Never Used  .  Alcohol use No    Review of Systems Constitutional: No fever/chills Eyes: No visual changes. ENT: No sore throat. Cardiovascular: Denies chest pain. Respiratory: Denies shortness of breath. Gastrointestinal: positive for lower abdominal pain Genitourinary: Negative for dysuria. Musculoskeletal: Negative for back pain. Skin: Negative for rash. Neurological: Negative for headaches, focal weakness or numbness.  ____________________________________________   PHYSICAL EXAM:  VITAL SIGNS: ED Triage Vitals  Enc Vitals Group     BP 08/03/17 1005 (!) 148/96     Pulse Rate 08/03/17 1005 97     Resp 08/03/17 1005 13     Temp 08/03/17 1005 98.3 F (36.8 C)     Temp Source 08/03/17 1005 Oral     SpO2 08/03/17 1005 98 %     Weight 08/03/17 1006 193 lb (87.5 kg)     Height 08/03/17 1006 5\' 1"  (1.549 m)     Head Circumference --      Peak Flow --      Pain Score 08/03/17 1015 8   Constitutional: Alert and oriented. Well appearing and in no distress. Eyes: Conjunctivae are normal.  ENT   Head: Normocephalic and atraumatic.   Nose: No congestion/rhinnorhea.   Mouth/Throat: Mucous membranes are moist.   Neck: No stridor. Hematological/Lymphatic/Immunilogical: No cervical lymphadenopathy. Cardiovascular: Normal rate, regular rhythm.  No murmurs, rubs, or gallops. Respiratory: Normal respiratory effort without tachypnea nor retractions. Breath sounds are clear and equal bilaterally. No wheezes/rales/rhonchi. Gastrointestinal: Soft and tender to palpation in the lower abdomen. No rebound. No guarding.  Genitourinary: Deferred Musculoskeletal: Normal range of motion in all extremities. No lower extremity edema.  Neurologic:  Normal speech and language. No gross focal neurologic deficits are appreciated.  Skin:  Skin is warm, dry and intact. No rash noted. Psychiatric: Mood and affect are normal. Speech and behavior are normal. Patient exhibits appropriate insight and  judgment.  ____________________________________________    LABS (pertinent positives/negatives)  Labs Reviewed  COMPREHENSIVE METABOLIC PANEL - Abnormal; Notable for the following:       Result Value   Potassium 3.4 (*)    Glucose, Bld 132 (*)    Alkaline Phosphatase 129 (*)    All other components within normal limits  CBC - Abnormal; Notable for the following:    WBC 11.3 (*)    All other components within normal limits  URINALYSIS, COMPLETE (UACMP) WITH MICROSCOPIC - Abnormal; Notable for the following:    Color, Urine STRAW (*)    APPearance CLEAR (*)    Bacteria, UA FEW (*)    Squamous Epithelial / LPF 0-5 (*)    All other components within normal limits  LIPASE, BLOOD     ____________________________________________   EKG  None  ____________________________________________    RADIOLOGY  CT abd/pel IMPRESSION:  1. Inflammation around the proximal sigmoid colon with diverticula  suggesting diverticulitis, without abscess. Consider follow-up to  exclude mucosal lesion.    ____________________________________________   PROCEDURES  Procedures  ____________________________________________   INITIAL IMPRESSION / ASSESSMENT AND PLAN / ED COURSE  Pertinent labs & imaging results that were available during my care of the patient were reviewed by me and considered in my medical decision making (see chart for details).  Patient presented to the emergency department today because of concerns for abdominal pain. CT scan is consistent with diverticulitis. Patient will be given a prescription for antibiotics, pain medication and nausea medication. ____________________________________________   FINAL CLINICAL IMPRESSION(S) / ED DIAGNOSES  Final diagnoses:  Diverticulitis     Note: This dictation was prepared with Dragon dictation. Any transcriptional errors that result from this process are unintentional     Phineas Semen, MD 08/03/17 1537

## 2018-05-12 IMAGING — CT CT ABD-PELV W/ CM
2 of 5 series · 16 of 46 positions shown, 18 images · IV contrast (APPLIED)
Comparison: 05/22/2009

CLINICAL DATA: LLQ pain x today into left flank, hx of hyster.,
appy and chole

EXAM:
CT ABDOMEN AND PELVIS WITH CONTRAST
TECHNIQUE: Multidetector CT imaging of the abdomen and pelvis was performed
using the standard protocol following bolus administration of
intravenous contrast.
CONTRAST:  100mL 2LI0FH-4LL IOPAMIDOL (2LI0FH-4LL) INJECTION 61%

[Series 2: routine abd/pel with · axial · 0.75mm/px · z∈[-483,-93]mm · 13 of 90 slices shown, 15 images]
[im 6/90  soft-tissue]
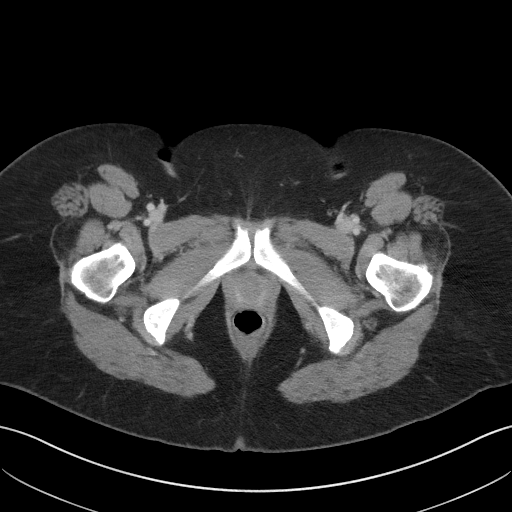
[im 6/90  bone]
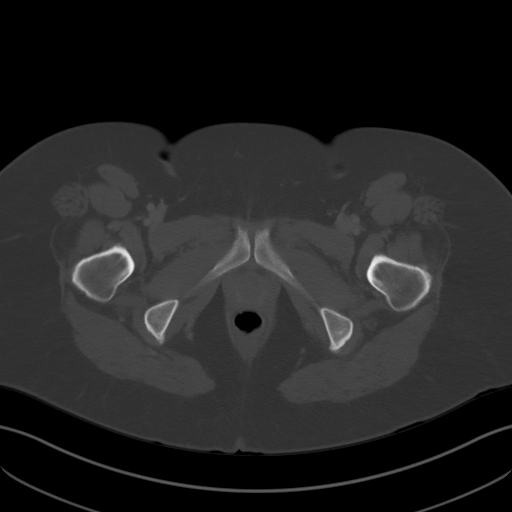
[im 12/90  soft-tissue]
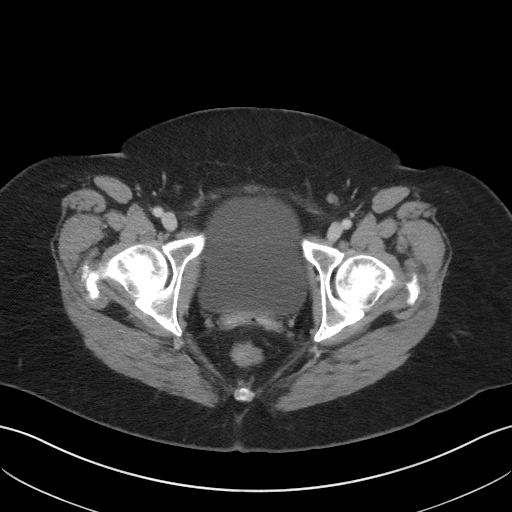
[im 17/90  soft-tissue]
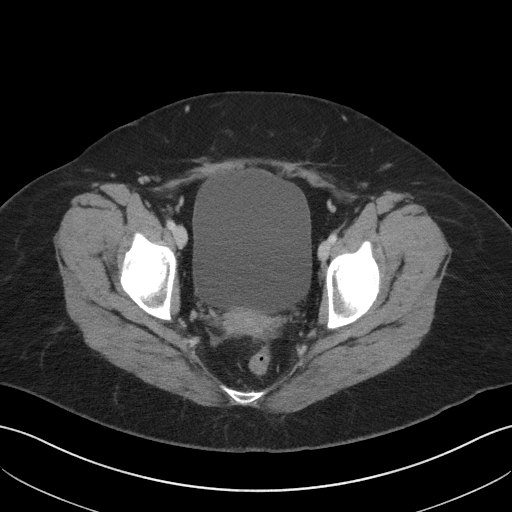
[im 28/90  soft-tissue]
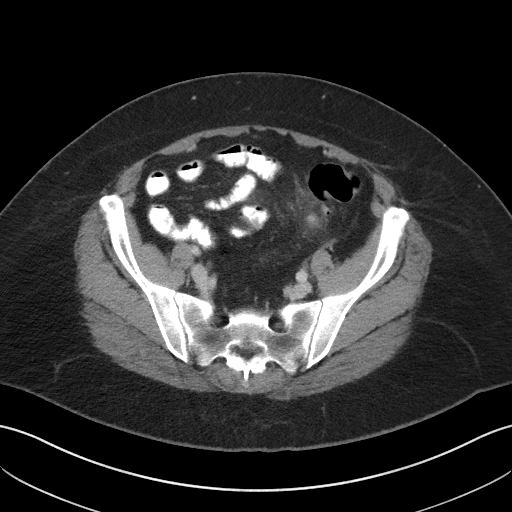
[im 34/90  soft-tissue]
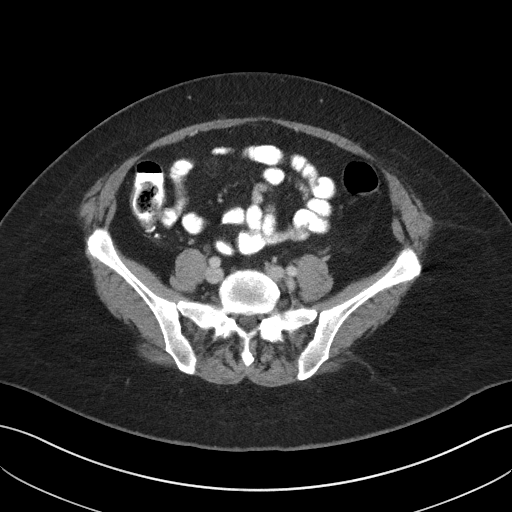
[im 39/90  soft-tissue]
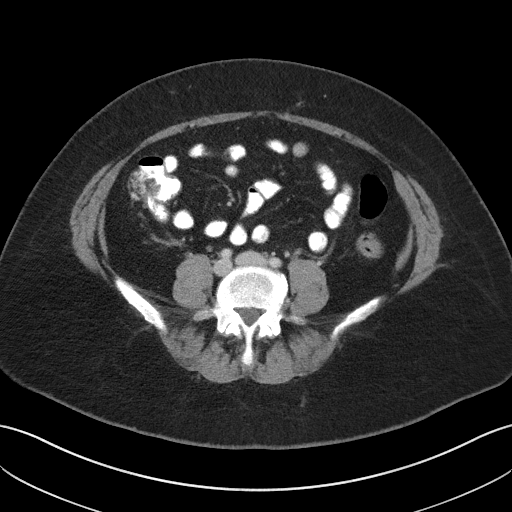
[im 45/90  soft-tissue]
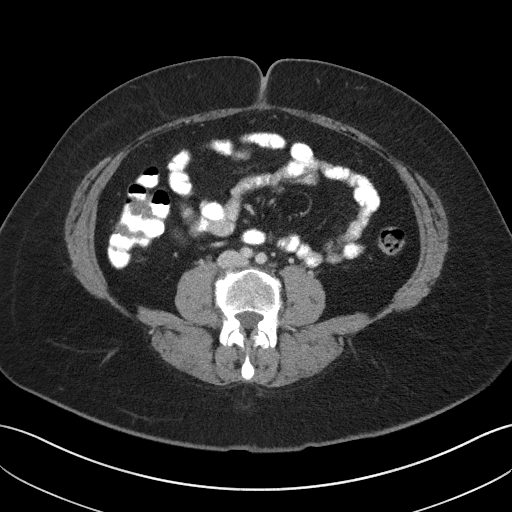
[im 51/90  soft-tissue]
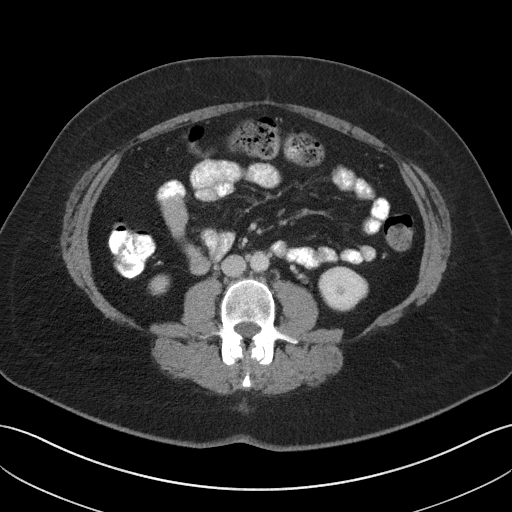
[im 56/90  soft-tissue]
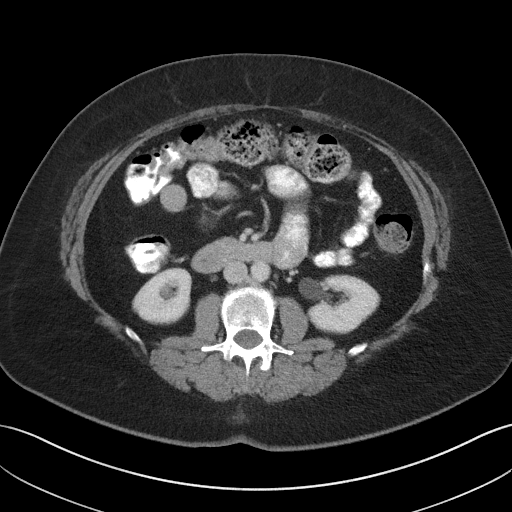
[im 56/90  bone]
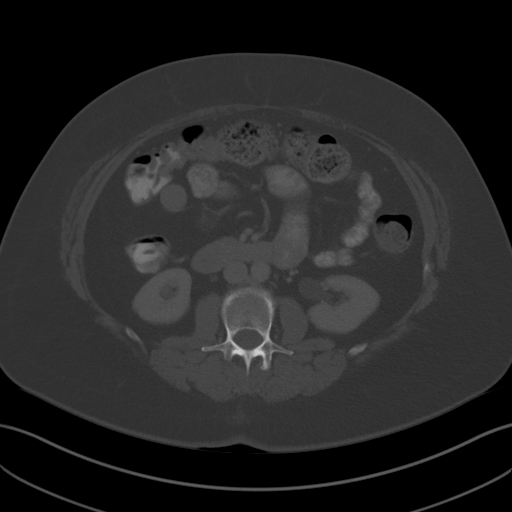
[im 62/90  soft-tissue]
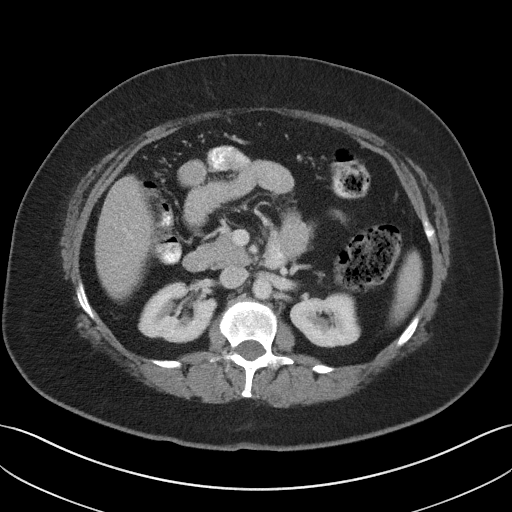
[im 73/90  soft-tissue]
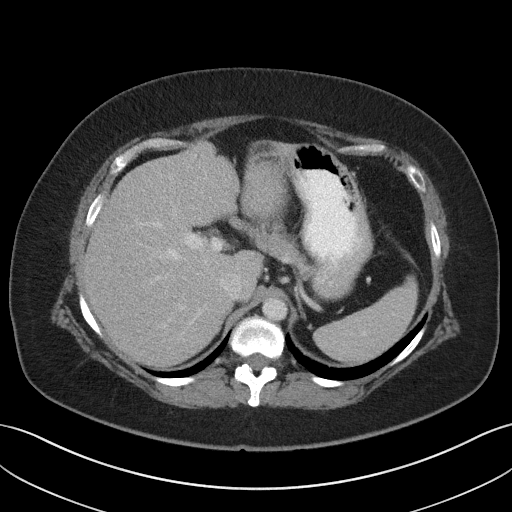
[im 78/90  soft-tissue]
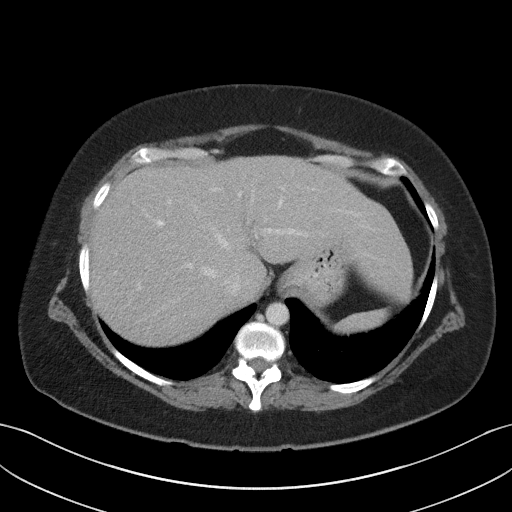
[im 84/90  soft-tissue]
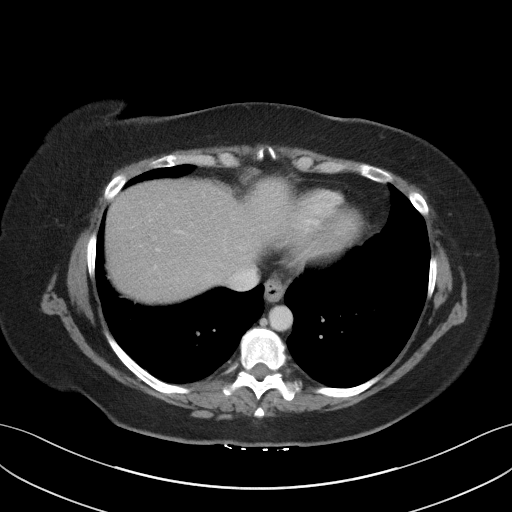

[Series 5: coronal st · coronal · 0.68mm/px · 3 of 97 slices shown]
[im 33/97  soft-tissue]
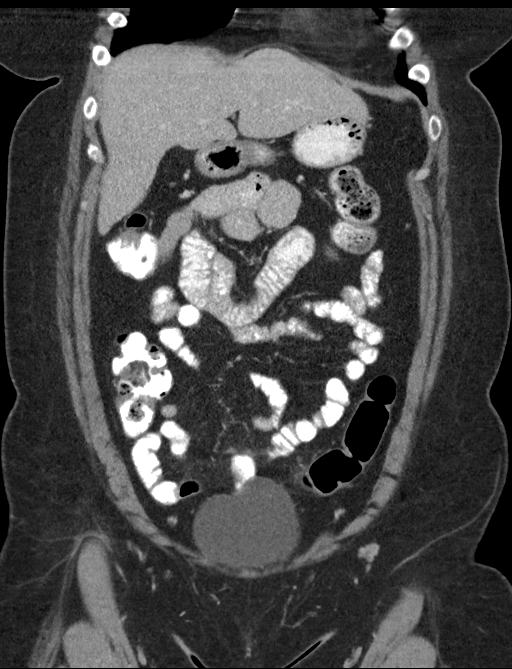
[im 43/97  soft-tissue]
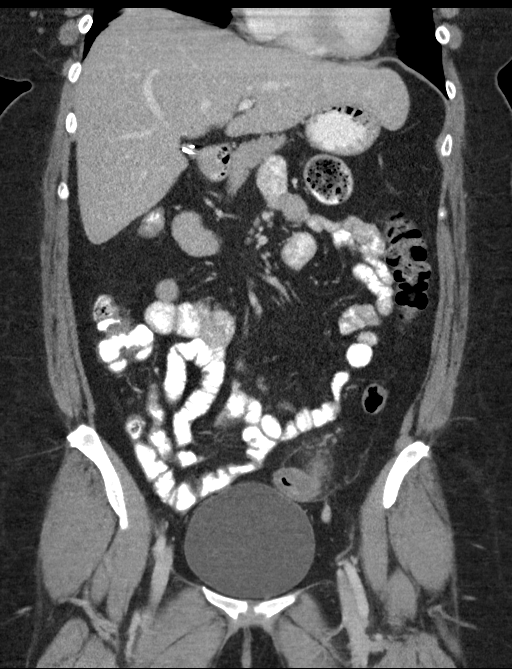
[im 54/97  soft-tissue]
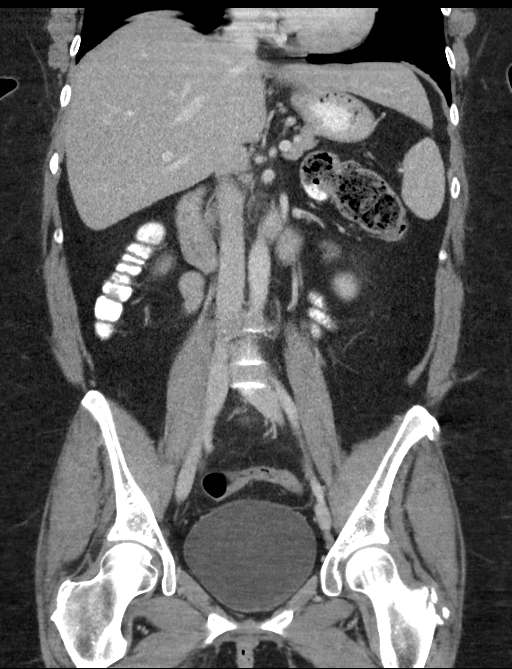

[16 of 46 positions shown; findings below may reference images not displayed]

FINDINGS: Lower chest: No acute abnormality.

Hepatobiliary: No focal liver abnormality is seen. Status post
cholecystectomy. No biliary dilatation.

Pancreas: Unremarkable. No pancreatic ductal dilatation or
surrounding inflammatory changes.

Spleen: Normal in size without focal abnormality.

Adrenals/Urinary Tract: Adrenal glands are unremarkable. Kidneys are
normal, without renal calculi, focal lesion, or hydronephrosis.
Bladder is physiologically distended, unremarkable.

Stomach/Bowel: Stomach is nondilated. Small bowel decompressed.
Appendix surgically absent. The colon is nondilated. A few distal
descending and sigmoid diverticula. Irregular wall thickening in the
proximal sigmoid colon with adjacent inflammatory/ edematous changes
and a single prominent diverticulum. No abscess or evident
perforation.

Vascular/Lymphatic: No significant vascular findings are present. No
enlarged abdominal or pelvic lymph nodes.

Reproductive: Status post hysterectomy. No adnexal masses.

Other: No ascites.  No free air.

Musculoskeletal: Negative for fracture or worrisome bone lesion.
IMPRESSION: 1. Inflammation around the proximal sigmoid colon with diverticula
suggesting diverticulitis, without abscess. Consider follow-up to
exclude mucosal lesion.

## 2020-12-28 ENCOUNTER — Emergency Department: Payer: BC Managed Care – PPO

## 2020-12-28 ENCOUNTER — Other Ambulatory Visit: Payer: Self-pay

## 2020-12-28 ENCOUNTER — Encounter: Payer: Self-pay | Admitting: Emergency Medicine

## 2020-12-28 DIAGNOSIS — U071 COVID-19: Principal | ICD-10-CM | POA: Diagnosis present

## 2020-12-28 DIAGNOSIS — Z882 Allergy status to sulfonamides status: Secondary | ICD-10-CM

## 2020-12-28 DIAGNOSIS — Z6835 Body mass index (BMI) 35.0-35.9, adult: Secondary | ICD-10-CM

## 2020-12-28 DIAGNOSIS — Z90710 Acquired absence of both cervix and uterus: Secondary | ICD-10-CM

## 2020-12-28 DIAGNOSIS — Z888 Allergy status to other drugs, medicaments and biological substances status: Secondary | ICD-10-CM

## 2020-12-28 DIAGNOSIS — J9601 Acute respiratory failure with hypoxia: Secondary | ICD-10-CM | POA: Diagnosis present

## 2020-12-28 DIAGNOSIS — Z881 Allergy status to other antibiotic agents status: Secondary | ICD-10-CM

## 2020-12-28 DIAGNOSIS — J1282 Pneumonia due to coronavirus disease 2019: Secondary | ICD-10-CM | POA: Diagnosis present

## 2020-12-28 DIAGNOSIS — E079 Disorder of thyroid, unspecified: Secondary | ICD-10-CM | POA: Diagnosis present

## 2020-12-28 DIAGNOSIS — I10 Essential (primary) hypertension: Secondary | ICD-10-CM | POA: Diagnosis present

## 2020-12-28 DIAGNOSIS — F32A Depression, unspecified: Secondary | ICD-10-CM | POA: Diagnosis present

## 2020-12-28 DIAGNOSIS — Z79899 Other long term (current) drug therapy: Secondary | ICD-10-CM

## 2020-12-28 DIAGNOSIS — E669 Obesity, unspecified: Secondary | ICD-10-CM | POA: Diagnosis present

## 2020-12-28 DIAGNOSIS — K219 Gastro-esophageal reflux disease without esophagitis: Secondary | ICD-10-CM | POA: Diagnosis present

## 2020-12-28 DIAGNOSIS — M549 Dorsalgia, unspecified: Secondary | ICD-10-CM | POA: Diagnosis present

## 2020-12-28 DIAGNOSIS — F419 Anxiety disorder, unspecified: Secondary | ICD-10-CM | POA: Diagnosis present

## 2020-12-28 LAB — BASIC METABOLIC PANEL
Anion gap: 10 (ref 5–15)
BUN: 13 mg/dL (ref 6–20)
CO2: 29 mmol/L (ref 22–32)
Calcium: 8.6 mg/dL — ABNORMAL LOW (ref 8.9–10.3)
Chloride: 99 mmol/L (ref 98–111)
Creatinine, Ser: 0.8 mg/dL (ref 0.44–1.00)
GFR, Estimated: >60 mL/min (ref >60)
Glucose, Bld: 103 mg/dL — ABNORMAL HIGH (ref 70–99)
Potassium: 4 mmol/L (ref 3.5–5.1)
Sodium: 138 mmol/L (ref 135–145)

## 2020-12-28 LAB — CBC
HCT: 42.1 % (ref 36.0–46.0)
Hemoglobin: 13.9 g/dL (ref 12.0–15.0)
MCH: 28.9 pg (ref 26.0–34.0)
MCHC: 33 g/dL (ref 30.0–36.0)
MCV: 87.5 fL (ref 80.0–100.0)
Platelets: 184 K/uL (ref 150–400)
RBC: 4.81 MIL/uL (ref 3.87–5.11)
RDW: 12 % (ref 11.5–15.5)
WBC: 4.9 K/uL (ref 4.0–10.5)
nRBC: 0 % (ref 0.0–0.2)

## 2020-12-28 LAB — TROPONIN I (HIGH SENSITIVITY): Troponin I (High Sensitivity): 6 ng/L

## 2020-12-28 NOTE — ED Triage Notes (Signed)
Pt to ED from home c/o hypoxia and SOB since dx COVID 1/09.  States pain to mid back that radiates around to left chest.  States monitoring oxygen sats at home as low at 83% but with deep breathing will go up to 96%.  Does not wear oxygen at home.  Productive cough with green and some pink tinged sputum.  Pt ambulatory, A&Ox4, chest rise even and unlabored, in NAD at this time.

## 2020-12-29 ENCOUNTER — Encounter: Payer: Self-pay | Admitting: Internal Medicine

## 2020-12-29 ENCOUNTER — Inpatient Hospital Stay
Admission: EM | Admit: 2020-12-29 | Discharge: 2021-01-02 | DRG: 177 | Disposition: A | Discharge status: Home / Self Care | Payer: BC Managed Care – PPO | Attending: Internal Medicine | Admitting: Internal Medicine

## 2020-12-29 ENCOUNTER — Inpatient Hospital Stay: Payer: BC Managed Care – PPO

## 2020-12-29 DIAGNOSIS — I10 Essential (primary) hypertension: Secondary | ICD-10-CM | POA: Diagnosis present

## 2020-12-29 DIAGNOSIS — J9601 Acute respiratory failure with hypoxia: Secondary | ICD-10-CM | POA: Diagnosis present

## 2020-12-29 DIAGNOSIS — Z6835 Body mass index (BMI) 35.0-35.9, adult: Secondary | ICD-10-CM | POA: Diagnosis not present

## 2020-12-29 DIAGNOSIS — U071 COVID-19: Secondary | ICD-10-CM | POA: Diagnosis present

## 2020-12-29 DIAGNOSIS — E079 Disorder of thyroid, unspecified: Secondary | ICD-10-CM | POA: Diagnosis present

## 2020-12-29 DIAGNOSIS — Z79899 Other long term (current) drug therapy: Secondary | ICD-10-CM | POA: Diagnosis not present

## 2020-12-29 DIAGNOSIS — Z881 Allergy status to other antibiotic agents status: Secondary | ICD-10-CM | POA: Diagnosis not present

## 2020-12-29 DIAGNOSIS — E669 Obesity, unspecified: Secondary | ICD-10-CM | POA: Diagnosis present

## 2020-12-29 DIAGNOSIS — J1282 Pneumonia due to coronavirus disease 2019: Secondary | ICD-10-CM

## 2020-12-29 DIAGNOSIS — M549 Dorsalgia, unspecified: Secondary | ICD-10-CM | POA: Diagnosis present

## 2020-12-29 DIAGNOSIS — F419 Anxiety disorder, unspecified: Secondary | ICD-10-CM | POA: Diagnosis present

## 2020-12-29 DIAGNOSIS — Z90710 Acquired absence of both cervix and uterus: Secondary | ICD-10-CM | POA: Diagnosis not present

## 2020-12-29 DIAGNOSIS — K219 Gastro-esophageal reflux disease without esophagitis: Secondary | ICD-10-CM

## 2020-12-29 DIAGNOSIS — J9600 Acute respiratory failure, unspecified whether with hypoxia or hypercapnia: Secondary | ICD-10-CM | POA: Diagnosis not present

## 2020-12-29 DIAGNOSIS — Z888 Allergy status to other drugs, medicaments and biological substances status: Secondary | ICD-10-CM | POA: Diagnosis not present

## 2020-12-29 DIAGNOSIS — Z882 Allergy status to sulfonamides status: Secondary | ICD-10-CM | POA: Diagnosis not present

## 2020-12-29 DIAGNOSIS — F32A Depression, unspecified: Secondary | ICD-10-CM | POA: Diagnosis present

## 2020-12-29 HISTORY — DX: Anxiety disorder, unspecified: F41.9

## 2020-12-29 HISTORY — DX: Gastro-esophageal reflux disease without esophagitis: K21.9

## 2020-12-29 HISTORY — DX: Disorder of thyroid, unspecified: E07.9

## 2020-12-29 HISTORY — DX: Essential (primary) hypertension: I10

## 2020-12-29 LAB — TROPONIN I (HIGH SENSITIVITY): Troponin I (High Sensitivity): 5 ng/L (ref <18)

## 2020-12-29 MED ORDER — ESCITALOPRAM OXALATE 10 MG PO TABS
20.0000 mg | ORAL_TABLET | Freq: Every day | ORAL | Status: DC
  Administered 2020-12-30 – 2021-01-02 (×4): 20 mg via ORAL
  Filled 2020-12-29 (×4): qty 2

## 2020-12-29 MED ORDER — SODIUM CHLORIDE 0.9% FLUSH: 3.0000 mL | INTRAVENOUS | Status: DC | PRN

## 2020-12-29 MED ORDER — ONDANSETRON HCL 4 MG PO TABS: 4.0000 mg | ORAL_TABLET | Freq: Four times a day (QID) | ORAL | Status: DC | PRN

## 2020-12-29 MED ORDER — LACTATED RINGERS IV BOLUS
1000.0000 mL | Freq: Once | INTRAVENOUS | Status: AC
  Administered 2020-12-29: 1000 mL via INTRAVENOUS

## 2020-12-29 MED ORDER — FLUTICASONE PROPIONATE 50 MCG/ACT NA SUSP
1.0000 | Freq: Every day | NASAL | Status: DC | PRN
  Filled 2020-12-29: qty 16

## 2020-12-29 MED ORDER — SODIUM CHLORIDE 0.9 % IV SOLN
250.0000 mL | INTRAVENOUS | Status: DC | PRN
Start: 2020-12-29 — End: 2021-01-02

## 2020-12-29 MED ORDER — ACETAMINOPHEN 325 MG PO TABS
650.0000 mg | ORAL_TABLET | Freq: Four times a day (QID) | ORAL | Status: DC | PRN
  Filled 2020-12-29: qty 2

## 2020-12-29 MED ORDER — SODIUM CHLORIDE 0.9 % IV SOLN
200.0000 mg | Freq: Once | INTRAVENOUS | Status: AC
  Administered 2020-12-29: 200 mg via INTRAVENOUS
  Filled 2020-12-29: qty 200

## 2020-12-29 MED ORDER — SODIUM CHLORIDE 0.9% FLUSH
3.0000 mL | Freq: Two times a day (BID) | INTRAVENOUS | Status: DC
  Administered 2020-12-29 – 2021-01-02 (×8): 3 mL via INTRAVENOUS

## 2020-12-29 MED ORDER — ACETAMINOPHEN 500 MG PO TABS
1000.0000 mg | ORAL_TABLET | Freq: Once | ORAL | Status: AC
  Administered 2020-12-29: 1000 mg via ORAL
  Filled 2020-12-29: qty 2

## 2020-12-29 MED ORDER — SODIUM CHLORIDE 0.9 % IV SOLN
100.0000 mg | Freq: Every day | INTRAVENOUS | Status: AC
  Administered 2020-12-30 – 2021-01-02 (×4): 100 mg via INTRAVENOUS
  Filled 2020-12-29: qty 20
  Filled 2020-12-29 (×3): qty 100

## 2020-12-29 MED ORDER — ALBUTEROL SULFATE HFA 108 (90 BASE) MCG/ACT IN AERS
2.0000 | INHALATION_SPRAY | Freq: Four times a day (QID) | RESPIRATORY_TRACT | Status: DC
  Administered 2020-12-29 – 2021-01-02 (×14): 2 via RESPIRATORY_TRACT
  Filled 2020-12-29 (×2): qty 6.7

## 2020-12-29 MED ORDER — DEXAMETHASONE SODIUM PHOSPHATE 10 MG/ML IJ SOLN
6.0000 mg | Freq: Once | INTRAMUSCULAR | Status: AC
  Administered 2020-12-29: 6 mg via INTRAVENOUS
  Filled 2020-12-29: qty 1

## 2020-12-29 MED ORDER — BUTALBITAL-APAP-CAFFEINE 50-325-40 MG PO TABS: 1.0000 | ORAL_TABLET | ORAL | Status: DC | PRN

## 2020-12-29 MED ORDER — GUAIFENESIN-DM 100-10 MG/5ML PO SYRP: 10.0000 mL | ORAL_SOLUTION | ORAL | Status: DC | PRN

## 2020-12-29 MED ORDER — ONDANSETRON HCL 4 MG/2ML IJ SOLN: 4.0000 mg | Freq: Four times a day (QID) | INTRAMUSCULAR | Status: DC | PRN

## 2020-12-29 MED ORDER — PANTOPRAZOLE SODIUM 40 MG PO TBEC
40.0000 mg | DELAYED_RELEASE_TABLET | Freq: Every day | ORAL | Status: DC
  Administered 2020-12-29 – 2021-01-02 (×5): 40 mg via ORAL
  Filled 2020-12-29 (×5): qty 1

## 2020-12-29 MED ORDER — DEXAMETHASONE SODIUM PHOSPHATE 10 MG/ML IJ SOLN
6.0000 mg | INTRAMUSCULAR | Status: DC
  Administered 2020-12-30 – 2021-01-02 (×4): 6 mg via INTRAVENOUS
  Filled 2020-12-29 (×4): qty 1

## 2020-12-29 MED ORDER — ASCORBIC ACID 500 MG PO TABS
500.0000 mg | ORAL_TABLET | Freq: Every day | ORAL | Status: DC
  Administered 2020-12-29 – 2021-01-02 (×5): 500 mg via ORAL
  Filled 2020-12-29 (×5): qty 1

## 2020-12-29 MED ORDER — HYDROCOD POLST-CPM POLST ER 10-8 MG/5ML PO SUER: 5.0000 mL | Freq: Two times a day (BID) | ORAL | Status: DC | PRN

## 2020-12-29 MED ORDER — LORATADINE 10 MG PO TABS
10.0000 mg | ORAL_TABLET | Freq: Every day | ORAL | Status: DC
Start: 2020-12-29 — End: 2021-01-02
  Administered 2020-12-29 – 2021-01-02 (×5): 10 mg via ORAL
  Filled 2020-12-29 (×5): qty 1

## 2020-12-29 MED ORDER — ENOXAPARIN SODIUM 40 MG/0.4ML ~~LOC~~ SOLN
40.0000 mg | SUBCUTANEOUS | Status: DC
  Administered 2020-12-29 – 2021-01-01 (×4): 40 mg via SUBCUTANEOUS
  Filled 2020-12-29 (×4): qty 0.4

## 2020-12-29 MED ORDER — ZINC SULFATE 220 (50 ZN) MG PO CAPS
220.0000 mg | ORAL_CAPSULE | Freq: Every day | ORAL | Status: DC
  Administered 2020-12-29 – 2021-01-02 (×5): 220 mg via ORAL
  Filled 2020-12-29 (×5): qty 1

## 2020-12-29 MED ORDER — IOHEXOL 350 MG/ML SOLN
75.0000 mL | Freq: Once | INTRAVENOUS | Status: AC | PRN
  Administered 2020-12-29: 75 mL via INTRAVENOUS
  Filled 2020-12-29: qty 75

## 2020-12-29 NOTE — Progress Notes (Signed)
Remdesivir - Pharmacy Brief Note   O:  CXR: Moderate severity bilateral multifocal infiltrates, right greater than left. SpO2: 93-95% on 2L    A/P:  Remdesivir 200 mg IVPB once followed by 100 mg IVPB daily x 4 days.   Gardner Candle, PharmD, BCPS Clinical Pharmacist 12/29/2020 9:17 AM

## 2020-12-29 NOTE — ED Provider Notes (Addendum)
[facility] Emergency Department Provider Note ____________________________________________   Event Date/Time   First MD Initiated Contact with Patient 12/29/20 [phone]     (approximate)  I have reviewed the triage vital signs and the nursing notes.  HISTORY  Chief Complaint Shortness of Breath   HPI Suzanne Cox is a 55 y.o. femalewho presents to the ED for evaluation of shortness of breath.  Chart review indicates obesity, HTN, GERD and anxiety. Patient is not vaccinated for COVID-19.  Patient reports symptom onset on 1/9, tested positive for COVID on 1/10.  Patient reports shortness of breath, dyspnea on exertion and nonproductive cough.  Denies productive cough, emesis, abdominal pain, syncope or dysuria.  She reports intermittent fevers at home, treated with Tylenol.  She reports midthoracic back pain that is atraumatic, 4/10 intensity.  Aching.  Reports oxygen saturations of 83% at home, so she presents to the ED for evaluation.      Past Medical History:  Diagnosis Date  . Acid reflux   . Anxiety   . Hypertension   . Thyroid disease     There are no problems to display for this patient.   Past Surgical History:  Procedure Laterality Date  . ABDOMINAL HYSTERECTOMY    . CESAREAN SECTION    . TONSILLECTOMY      Prior to Admission medications   Medication Sig Start Date End Date Taking? Authorizing Provider  butalbital-acetaminophen-caffeine (FIORICET, ESGIC) 50-325-40 MG tablet Take 1 tablet by mouth every 4 (four) hours as needed. 07/04/15   [provider]  Cetirizine HCl 10 MG CAPS Take 1 tablet by mouth daily at 12 noon.    [provider]  escitalopram (LEXAPRO) 10 MG tablet Take 1 tablet by mouth daily at 12 noon. 09/16/15   [provider]  NASONEX 50 MCG/ACT nasal spray Place 2 sprays into the nose daily as needed. 07/07/15   [provider]  omeprazole (PRILOSEC) 40 MG capsule Take 1 capsule  (40 mg total) by mouth daily. 09/29/15 09/28/16  Rockne Menghini, MD  ondansetron (ZOFRAN) 4 MG tablet Take 1 tablet (4 mg total) by mouth every 8 (eight) hours as needed for nausea or vomiting. 08/03/17   Phineas Semen, MD    Allergies Theophyllines, Topamax [topiramate], Sulfa antibiotics, and Tetracyclines & related  History reviewed. No pertinent family history.  Social History Social History   Tobacco Use  . Smoking status: Never Smoker  . Smokeless tobacco: Never Used  Substance Use Topics  . Alcohol use: No  . Drug use: No    Review of Systems  Constitutional: Positive for fever/chills Eyes: No visual changes. ENT: No sore throat. Cardiovascular: Denies chest pain. Respiratory: Positive shortness of breath and cough Gastrointestinal: No abdominal pain.   no vomiting.  No diarrhea.  No constipation.  Positive for poor appetite and nausea. Genitourinary: Negative for dysuria. Musculoskeletal: Positive for midthoracic back pain Skin: Negative for rash. Neurological: Negative for headaches, focal weakness or numbness.  ____________________________________________   PHYSICAL EXAM:  VITAL SIGNS: Vitals:   12/29/20 0455 12/29/20 0500  BP: 122/84   Pulse: 93   Resp: 18   Temp:    SpO2: (!) 89% 94%     Constitutional: Alert and oriented.  Appears uncomfortable, but in no acute distress.  Obese Eyes: Conjunctivae are normal. PERRL. EOMI. Head: Atraumatic. Nose: No congestion/rhinnorhea. Mouth/Throat: Mucous membranes are dry.  Oropharynx non-erythematous. Neck: No stridor. No cervical spine tenderness to palpation. Cardiovascular: Normal rate, regular rhythm. Grossly  normal heart sounds.  Good peripheral circulation. Respiratory: Tachypneic to about 30, occasionally coughing during our conversation.  No retractions or distress.  Clear lungs throughout. Gastrointestinal: Soft , nondistended, nontender to palpation. No CVA tenderness. Musculoskeletal: No  lower extremity tenderness nor edema.  No joint effusions. No signs of acute trauma. Neurologic:  Normal speech and language. No gross focal neurologic deficits are appreciated. No gait instability noted. Skin:  Skin is warm, dry and intact. No rash noted. Psychiatric: Mood and affect are normal. Speech and behavior are normal.  ____________________________________________   LABS (all labs ordered are listed, but only abnormal results are displayed)  Labs Reviewed  BASIC METABOLIC PANEL - Abnormal; Notable for the following components:      Result Value   Glucose, Bld 103 (*)    Calcium 8.6 (*)    All other components within normal limits  CBC  TROPONIN I (HIGH SENSITIVITY)  TROPONIN I (HIGH SENSITIVITY)   ____________________________________________  12 Lead EKG Sinus rhythm, rate of 93 bpm.  Normal axis and intervals.  No evidence of acute ischemia.  ____________________________________________  RADIOLOGY  ED MD interpretation: 2 view CXR reviewed by me with patchy multifocal infiltrates consistent with COVID-19 without discrete lobar filtration  Official radiology report(s): DG Chest 2 View  Result Date: 12/28/2020 CLINICAL DATA:  COVID positive with hypoxia and shortness of breath. EXAM: CHEST - 2 VIEW COMPARISON:  None. FINDINGS: Decreased lung volumes are seen which is likely secondary to the degree of patient inspiration. Moderate severity multifocal infiltrates are seen within the mid and lower right lung and left lung base. There is no evidence of a pleural effusion or pneumothorax. The heart size and mediastinal contours are within normal limits. The visualized skeletal structures are unremarkable. Radiopaque surgical clips are seen within the right upper quadrant. IMPRESSION: Moderate severity bilateral multifocal infiltrates, right greater than left. Electronically Signed   By: Aram Candela M.D.   On: 12/28/2020 23:15     ____________________________________________   PROCEDURES and INTERVENTIONS  Procedure(s) performed (including Critical Care):  .Critical Care Performed by: Delton Prairie, MD Authorized by: Delton Prairie, MD   Critical care provider statement:    Critical care time (minutes):  35   Critical care was necessary to treat or prevent imminent or life-threatening deterioration of the following conditions:  Respiratory failure   Critical care was time spent personally by me on the following activities:  Discussions with consultants, evaluation of patient's response to treatment, examination of patient, ordering and performing treatments and interventions, ordering and review of laboratory studies, ordering and review of radiographic studies, pulse oximetry, re-evaluation of patient's condition, obtaining history from patient or surrogate and review of old charts .1-3 Lead EKG Interpretation Performed by: Delton Prairie, MD Authorized by: Delton Prairie, MD     Interpretation: normal     ECG rate:  90   ECG rate assessment: normal     Rhythm: sinus rhythm     Ectopy: none     Conduction: normal      Medications  dexamethasone (DECADRON) injection 6 mg (has no administration in time range)  lactated ringers bolus 1,000 mL (has no administration in time range)  acetaminophen (TYLENOL) tablet 1,000 mg (has no administration in time range)    ____________________________________________   MDM / ED COURSE   Unvaccinated and obese 55 year old woman presents to the ED with worsening symptoms in the setting of COVID-19 requiring Decadron course and medical admission.  Patient hypoxic to the mid 80s on  room air necessitating 2 L nasal cannula.  Otherwise hemodynamically stable.  Exam with mild tachypnea and discomfort, but no distress, signs of trauma or neurovascular deficits.  CXR without lobar infiltration to suggest superimposed bacterial pneumonia.  Blood work without leukocytosis or signs  of sepsis.  EKG nonischemic.  Will provide rehydration, Decadron due to hypoxia and admit to the hospitalist medicine for further work-up and management.      ____________________________________________   FINAL CLINICAL IMPRESSION(S) / ED DIAGNOSES  Final diagnoses:  Acute hypoxemic respiratory failure due to COVID-19 [facility])     ED Discharge Orders    None       Meiah Zamudio   Note:  This document was prepared using Dragon voice recognition software and may include unintentional dictation errors.   Delton Prairie, MD 12/29/20 1610    Delton Prairie, MD 12/29/20 [phone]

## 2020-12-29 NOTE — Progress Notes (Signed)
Patient assisted to bedside commode without difficulty.  No needs voiced.

## 2020-12-29 NOTE — ED Notes (Signed)
Pt placed on cardiac monitoring by Kara Mead, EDT.

## 2020-12-29 NOTE — H&P (Signed)
History and Physical    ALANNAH AVERHART NFA:[id number] DOB: 1966-06-30 DOA: 12/29/2020  PCP: Rolm Gala, MD   Patient coming from: Home  I have personally briefly reviewed patient's old medical records in [facility]  Chief Complaint: Shortness of breath  HPI: Suzanne Cox is a 55 y.o. female with medical history significant for obesity, hypertension, GERD, depression and anxiety who presents to the emergency room for evaluation of worsening shortness of breath.  Patient states she started having symptoms on December 18, 2020 and had a positive COVID-19 test on 12/19/20.  Her symptoms include cough with occasional blood-streaked sputum, nausea, diarrhea, fever, chills and shortness of breath which has worsened over the last couple of days.  She also complains of pain in the midthoracic area associated with dizziness and lightheadedness.  She had room air pulse oximetry of 83% which prompted her visit to the emergency room. Patient is unvaccinated. She denies having any abdominal pain, no urinary frequency, no nocturia, no dysuria, no headaches, no blurry vision,  Labs show sodium 138, potassium 4.0, chloride 99, bicarb 29, glucose 103, BUN 13, creatinine 0.80, calcium 8.6, white count 4.9, hemoglobin 13.2, hematocrit 42.1, MCV 87.5, RDW 12, platelet count 184 Chest x-ray reviewed by me shows bilateral multifocal infiltrates, right greater than left. Twelve-lead EKG reviewed by me shows normal sinus rhythm with low voltage QRS    ED Course: Patient is a 55 year old unvaccinated female who presents to the emergency room for evaluation of worsening shortness of breath and hypoxia.  She tested positive for the COVID-19 virus on 12/19/20.  Chest x-ray shows bilateral multifocal infiltrates.  She will be admitted to the hospital for further evaluation and treatment.  Review of Systems: As per HPI otherwise all systems reviewed and negative.    Past Medical History:  Diagnosis Date   . Acid reflux   . Anxiety   . Hypertension   . Thyroid disease     Past Surgical History:  Procedure Laterality Date  . ABDOMINAL HYSTERECTOMY    . CESAREAN SECTION    . TONSILLECTOMY       reports that she has never smoked. She has never used smokeless tobacco. She reports that she does not drink alcohol and does not use drugs.  Allergies  Allergen Reactions  . Theophyllines Other (See Comments)    Headache  . Topamax [Topiramate] Itching  . Sulfa Antibiotics Rash  . Tetracyclines & Related Rash    History reviewed. No pertinent family history.   Prior to Admission medications   Medication Sig Start Date End Date Taking? Authorizing Provider  butalbital-acetaminophen-caffeine (FIORICET, ESGIC) 50-325-40 MG tablet Take 1 tablet by mouth every 4 (four) hours as needed. 07/04/15   [provider]  Cetirizine HCl 10 MG CAPS Take 1 tablet by mouth daily at 12 noon.    [provider]  escitalopram (LEXAPRO) 10 MG tablet Take 1 tablet by mouth daily at 12 noon. 09/16/15   [provider]  NASONEX 50 MCG/ACT nasal spray Place 2 sprays into the nose daily as needed. 07/07/15   [provider]  omeprazole (PRILOSEC) 40 MG capsule Take 1 capsule (40 mg total) by mouth daily. 09/29/15 09/28/16  Rockne Menghini, MD  ondansetron (ZOFRAN) 4 MG tablet Take 1 tablet (4 mg total) by mouth every 8 (eight) hours as needed for nausea or vomiting. 08/03/17   Phineas Semen, MD    Physical Exam: Vitals:   12/29/20 0500 12/29/20 0950 12/29/20 1100 12/29/20 1130  BP:  (!) 134/92 127/81 124/80  Pulse:  90 91 81  Resp:  (!) 22 (!) 27 (!) 30  Temp:      TempSrc:      SpO2: 94% 92% 94% 92%  Weight:      Height:         Vitals:   12/29/20 0500 12/29/20 0950 12/29/20 1100 12/29/20 1130  BP:  (!) 134/92 127/81 124/80  Pulse:  90 91 81  Resp:  (!) 22 (!) 27 (!) 30  Temp:      TempSrc:      SpO2: 94% 92% 94% 92%  Weight:      Height:         Constitutional: NAD, alert and oriented x 3.  Acutely ill-appearing Eyes: PERRL, lids and conjunctivae normal ENMT: Mucous membranes are moist.  Neck: normal, supple, no masses, no thyromegaly Respiratory: Air movement in both lung fields, no wheezing, no crackles. Normal respiratory effort. No accessory muscle use.  Cardiovascular: Regular rate and rhythm,no murmurs / rubs / gallops. No extremity edema. 2+ pedal pulses. No carotid bruits.  Abdomen: no tenderness, no masses palpated. No hepatosplenomegaly. Bowel sounds positive.  Musculoskeletal: no clubbing / cyanosis. No joint deformity upper and lower extremities.  Skin: no rashes, lesions, ulcers.  Neurologic: No gross focal neurologic deficit. Psychiatric: Normal mood and affect.   Labs on Admission: I have personally reviewed following labs and imaging studies  CBC: Recent Labs  Lab 12/28/20 2257  WBC 4.9  HGB 13.9  HCT 42.1  MCV 87.5  PLT 184   Basic Metabolic Panel: Recent Labs  Lab 12/28/20 2257  NA 138  K 4.0  CL 99  CO2 29  GLUCOSE 103*  BUN 13  CREATININE 0.80  CALCIUM 8.6*   GFR: Estimated Creatinine Clearance: 84.3 mL/min (by C-G formula based on SCr of 0.8 mg/dL). Liver Function Tests: No results for input(s): AST, ALT, ALKPHOS, BILITOT, PROT, ALBUMIN in the last 168 hours. No results for input(s): LIPASE, AMYLASE in the last 168 hours. No results for input(s): AMMONIA in the last 168 hours. Coagulation Profile: No results for input(s): INR, PROTIME in the last 168 hours. Cardiac Enzymes: No results for input(s): CKTOTAL, CKMB, CKMBINDEX, TROPONINI in the last 168 hours. BNP (last 3 results) No results for input(s): PROBNP in the last 8760 hours. HbA1C: No results for input(s): HGBA1C in the last 72 hours. CBG: No results for input(s): GLUCAP in the last 168 hours. Lipid Profile: No results for input(s): CHOL, HDL, LDLCALC, TRIG, CHOLHDL, LDLDIRECT in the last 72 hours. Thyroid Function  Tests: No results for input(s): TSH, T4TOTAL, FREET4, T3FREE, THYROIDAB in the last 72 hours. Anemia Panel: No results for input(s): VITAMINB12, FOLATE, FERRITIN, TIBC, IRON, RETICCTPCT in the last 72 hours. Urine analysis:    Component Value Date/Time   COLORURINE STRAW (A) 08/03/2017 1032   APPEARANCEUR CLEAR (A) 08/03/2017 1032   LABSPEC 1.009 08/03/2017 1032   PHURINE 6.0 08/03/2017 1032   GLUCOSEU NEGATIVE 08/03/2017 1032   HGBUR NEGATIVE 08/03/2017 1032   BILIRUBINUR NEGATIVE 08/03/2017 1032   KETONESUR NEGATIVE 08/03/2017 1032   PROTEINUR NEGATIVE 08/03/2017 1032   NITRITE NEGATIVE 08/03/2017 1032   LEUKOCYTESUR NEGATIVE 08/03/2017 1032    Radiological Exams on Admission: DG Chest 2 View  Result Date: 12/28/2020 CLINICAL DATA:  COVID positive with hypoxia and shortness of breath. EXAM: CHEST - 2 VIEW COMPARISON:  None. FINDINGS: Decreased lung volumes are seen which is likely secondary to the degree of  patient inspiration. Moderate severity multifocal infiltrates are seen within the mid and lower right lung and left lung base. There is no evidence of a pleural effusion or pneumothorax. The heart size and mediastinal contours are within normal limits. The visualized skeletal structures are unremarkable. Radiopaque surgical clips are seen within the right upper quadrant. IMPRESSION: Moderate severity bilateral multifocal infiltrates, right greater than left. Electronically Signed   By: Aram Candela M.D.   On: 12/28/2020 23:15    EKG: Independently reviewed.  Normal sinus rhythm Low voltage QRS  Assessment/Plan Principal Problem:   Pneumonia due to COVID-19 virus Active Problems:   Acid reflux   Hypertension   Anxiety   Thyroid disease   Acute respiratory failure due to COVID-19 (HCC)     Pneumonia due to COVID-19 with acute respiratory failure Patient is unvaccinated who presents for evaluation of worsening shortness of breath in the setting of COVID-19 positivity  and hypoxia with room air pulse oximetry at rest of 83% She is currently on 2 L of oxygen with improvement in her pulse oximetry to 94% Place patient on remdesivir per protocol Place patient on Decadron 6 mg IV daily Continue oxygen supplementation Supportive care with antitussives and bronchodilator therapy as needed.    Depression Continue Lexapro   GERD Continue Protonix   Obesity (BMI 35) Complicates overall prognosis and current    DVT prophylaxis: Lovenox Code Status: Full code Family Communication: 50% of time was spent discussing plan of care with patient at the bedside.  All questions and concerns have been addressed.  She verbalizes understanding and agrees with the plan. Disposition Plan: Back to previous home environment Consults called: None    Shawny Borkowski MD [facility]     12/29/2020, 12:04 PM

## 2020-12-30 DIAGNOSIS — F419 Anxiety disorder, unspecified: Secondary | ICD-10-CM

## 2020-12-30 DIAGNOSIS — I10 Essential (primary) hypertension: Secondary | ICD-10-CM

## 2020-12-30 LAB — CBC WITH DIFFERENTIAL/PLATELET
Abs Immature Granulocytes: 0.02 10*3/uL (ref 0.00–0.07)
Basophils Absolute: 0 10*3/uL (ref 0.0–0.1)
Basophils Relative: 0 %
Eosinophils Absolute: 0 10*3/uL (ref 0.0–0.5)
Eosinophils Relative: 0 %
HCT: 40.4 % (ref 36.0–46.0)
Hemoglobin: 13.5 g/dL (ref 12.0–15.0)
Immature Granulocytes: 1 %
Lymphocytes Relative: 27 %
Lymphs Abs: 0.8 10*3/uL (ref 0.7–4.0)
MCH: 29.2 pg (ref 26.0–34.0)
MCHC: 33.4 g/dL (ref 30.0–36.0)
MCV: 87.4 fL (ref 80.0–100.0)
Monocytes Absolute: 0.4 10*3/uL (ref 0.1–1.0)
Monocytes Relative: 12 %
Neutro Abs: 1.9 10*3/uL (ref 1.7–7.7)
Neutrophils Relative %: 60 %
Platelets: 256 10*3/uL (ref 150–400)
RBC: 4.62 MIL/uL (ref 3.87–5.11)
RDW: 12 % (ref 11.5–15.5)
Smear Review: NORMAL
WBC: 3.1 10*3/uL — ABNORMAL LOW (ref 4.0–10.5)
nRBC: 0 % (ref 0.0–0.2)

## 2020-12-30 LAB — COMPREHENSIVE METABOLIC PANEL
ALT: 58 U/L — ABNORMAL HIGH (ref 0–44)
AST: 32 U/L (ref 15–41)
Albumin: 3.3 g/dL — ABNORMAL LOW (ref 3.5–5.0)
Alkaline Phosphatase: 126 U/L (ref 38–126)
Anion gap: 11 (ref 5–15)
BUN: 20 mg/dL (ref 6–20)
CO2: 26 mmol/L (ref 22–32)
Calcium: 8.9 mg/dL (ref 8.9–10.3)
Chloride: 106 mmol/L (ref 98–111)
Creatinine, Ser: 0.66 mg/dL (ref 0.44–1.00)
GFR, Estimated: >60 mL/min (ref >60)
Glucose, Bld: 127 mg/dL — ABNORMAL HIGH (ref 70–99)
Potassium: 3.8 mmol/L (ref 3.5–5.1)
Sodium: 143 mmol/L (ref 135–145)
Total Bilirubin: 0.6 mg/dL (ref 0.3–1.2)
Total Protein: 7 g/dL (ref 6.5–8.1)

## 2020-12-30 LAB — C-REACTIVE PROTEIN: CRP: 4.6 mg/dL — ABNORMAL HIGH (ref <1.0)

## 2020-12-30 LAB — FIBRIN DERIVATIVES D-DIMER (ARMC ONLY): Fibrin derivatives D-dimer (ARMC): 920.01 ng/mL (FEU) — ABNORMAL HIGH (ref 0.00–499.00)

## 2020-12-30 LAB — HIV ANTIBODY (ROUTINE TESTING W REFLEX): HIV Screen 4th Generation wRfx: REACTIVE — AB

## 2020-12-30 LAB — PHOSPHORUS: Phosphorus: 3.6 mg/dL (ref 2.5–4.6)

## 2020-12-30 LAB — MAGNESIUM: Magnesium: 2.4 mg/dL (ref 1.7–2.4)

## 2020-12-30 LAB — FERRITIN: Ferritin: 869 ng/mL — ABNORMAL HIGH (ref 11–307)

## 2020-12-30 NOTE — Progress Notes (Signed)
Patient able to shower per MD

## 2020-12-30 NOTE — Progress Notes (Signed)
      INFECTIOUS DISEASE ATTENDING ADDENDUM:   Date: 12/30/2020  Patient name: Suzanne Cox  Medical record number: [id number]  Date of birth: January 03, 1966   Alerted via epic to prelim positive HIV test.  I am ordering HIV quantitative RNA given the slow turnaround on discriminatory assay.  If the patient truly has HIV I do not want to leave the hospital until she has medications on hand to treat it and has been seen by [place]   Paulette Blanch Dam 12/30/2020, 4:33 PM

## 2020-12-30 NOTE — Progress Notes (Signed)
1        [place] at [facility]   PATIENT NAME: Suzanne Cox    MR#:  [id number]  DATE OF BIRTH:  [birth year]  SUBJECTIVE:  CHIEF COMPLAINT:   Chief Complaint  Patient presents with  . Shortness of Breath  Short of breath and having cough.  Very pleasant REVIEW OF SYSTEMS:  Review of Systems  Constitutional: Negative for diaphoresis, fever, malaise/fatigue and weight loss.  HENT: Negative for ear discharge, ear pain, hearing loss, nosebleeds, sore throat and tinnitus.   Eyes: Negative for blurred vision and pain.  Respiratory: Positive for cough and shortness of breath. Negative for hemoptysis and wheezing.   Cardiovascular: Negative for chest pain, palpitations, orthopnea and leg swelling.  Gastrointestinal: Negative for abdominal pain, blood in stool, constipation, diarrhea, heartburn, nausea and vomiting.  Genitourinary: Negative for dysuria, frequency and urgency.  Musculoskeletal: Negative for back pain and myalgias.  Skin: Negative for itching and rash.  Neurological: Negative for dizziness, tingling, tremors, focal weakness, seizures, weakness and headaches.  Psychiatric/Behavioral: Negative for depression. The patient is not nervous/anxious.    DRUG ALLERGIES:   Allergies  Allergen Reactions  . Theophyllines Other (See Comments)    Headache  . Topamax [Topiramate] Itching  . Sulfa Antibiotics Rash  . Tetracyclines & Related Rash   VITALS:  Blood pressure 113/72, pulse 88, temperature 98.1 F (36.7 C), resp. rate 16, height 5\' 2"  (1.575 m), weight 88 kg, SpO2 93 %. PHYSICAL EXAMINATION:  Physical Exam HENT:     Head: Normocephalic and atraumatic.  Eyes:     Extraocular Movements: EOM normal.     Conjunctiva/sclera: Conjunctivae normal.     Pupils: Pupils are equal, round, and reactive to light.  Neck:     Thyroid: No thyromegaly.     Trachea: No tracheal deviation.  Cardiovascular:     Rate and Rhythm: Normal rate and regular rhythm.     Heart  sounds: Normal heart sounds.  Pulmonary:     Effort: Pulmonary effort is normal. No respiratory distress.     Breath sounds: Examination of the right-lower field reveals rhonchi. Examination of the left-lower field reveals rhonchi. Decreased breath sounds and rhonchi present. No wheezing.  Chest:     Chest wall: No tenderness.  Abdominal:     General: Bowel sounds are normal. There is no distension.     Palpations: Abdomen is soft.     Tenderness: There is no abdominal tenderness.  Musculoskeletal:        General: Normal range of motion.     Cervical back: Normal range of motion and neck supple.  Skin:    General: Skin is warm and dry.     Findings: No rash.  Neurological:     Mental Status: She is alert and oriented to person, place, and time.     Cranial Nerves: No cranial nerve deficit.    LABORATORY PANEL:  Female CBC Recent Labs  Lab 12/30/20 0629  WBC 3.1*  HGB 13.5  HCT 40.4  PLT 256   ------------------------------------------------------------------------------------------------------------------ Chemistries  Recent Labs  Lab 12/30/20 0629  NA 143  K 3.8  CL 106  CO2 26  GLUCOSE 127*  BUN 20  CREATININE 0.66  CALCIUM 8.9  MG 2.4  AST 32  ALT 58*  ALKPHOS 126  BILITOT 0.6   RADIOLOGY:  No results found. ASSESSMENT AND PLAN:  55 year old female with a known history of obesity, hypertension, GERD, depression and anxiety admitted for worsening shortness of  breath  Acute respiratory failure from pneumonia due to COVID-19 infection Requiring 2 l oxygen via nasal cannula Remdesivir day 2/5, continue steroids Antitussives and bronchodilators  Depression Lexapro  GERD Protonix  HIV Screening positive [facility] remotely monitoring and have ordered RNA quantitative so as to get her started on medication quickly  Obesity Body mass index is 35.48 kg/m.  Net IO Since Admission: 593.02 mL [12/30/20 1715]    1/20 -> COVID-19 pneumonia 1/21 -> on 2 l  oxygen.  On remdesivir day 2/5 and steroids.  HIV screening +     Level of care: Med-Surg  Status is: Inpatient  Remains inpatient appropriate because:IV treatments appropriate due to intensity of illness or inability to take PO   Dispo: The patient is from: Home              Anticipated d/c is to: Home              Anticipated d/c date is: 3 days              Patient currently is not medically stable to d/c.   Difficult to place patient No       DVT prophylaxis:       enoxaparin (LOVENOX) injection 40 mg Start: 12/29/20 1000     Family Communication:  "discussed with patient")   All the records are reviewed and case discussed with Care Management/Social Worker. Management plans discussed with the patient, nursing and they are in agreement.  CODE STATUS: Full Code   TOTAL TIME TAKING CARE OF THIS PATIENT: 35 minutes.   More than 50% of the time was spent in counseling/coordination of care: YES  POSSIBLE D/C IN 3-4 DAYS, DEPENDING ON CLINICAL CONDITION.   Delfino Lovett M.D on 12/30/2020 at 5:15 PM  [facility]   CC: Primary care physician; Rolm Gala, MD  Note: This dictation was prepared with Dragon dictation along with smaller phrase technology. Any transcriptional errors that result from this process are unintentional.

## 2020-12-30 NOTE — Hospital Course (Addendum)
1/20 -> COVID-19 pneumonia 1/21 -> on 2 l oxygen.  On remdesivir day 2/5 and steroids.  HIV screening + 1/22 -> patient surprised for her HIV +, she denies any exposure.  On room air now.  Day 3/5 of remdesivir 1/23 -> on 2 L nasal cannula.  Remdesivir day 4/5

## 2020-12-31 LAB — CBC WITH DIFFERENTIAL/PLATELET
Abs Immature Granulocytes: 0.04 10*3/uL (ref 0.00–0.07)
Basophils Absolute: 0 10*3/uL (ref 0.0–0.1)
Basophils Relative: 0 %
Eosinophils Absolute: 0 10*3/uL (ref 0.0–0.5)
Eosinophils Relative: 0 %
HCT: 40.5 % (ref 36.0–46.0)
Hemoglobin: 13.6 g/dL (ref 12.0–15.0)
Immature Granulocytes: 1 %
Lymphocytes Relative: 13 %
Lymphs Abs: 0.9 10*3/uL (ref 0.7–4.0)
MCH: 29.1 pg (ref 26.0–34.0)
MCHC: 33.6 g/dL (ref 30.0–36.0)
MCV: 86.5 fL (ref 80.0–100.0)
Monocytes Absolute: 0.4 10*3/uL (ref 0.1–1.0)
Monocytes Relative: 7 %
Neutro Abs: 5.1 10*3/uL (ref 1.7–7.7)
Neutrophils Relative %: 79 %
Platelets: 327 10*3/uL (ref 150–400)
RBC: 4.68 MIL/uL (ref 3.87–5.11)
RDW: 12.2 % (ref 11.5–15.5)
Smear Review: NORMAL
WBC: 6.4 10*3/uL (ref 4.0–10.5)
nRBC: 0 % (ref 0.0–0.2)

## 2020-12-31 LAB — COMPREHENSIVE METABOLIC PANEL
ALT: 58 U/L — ABNORMAL HIGH (ref 0–44)
AST: 16 U/L (ref 15–41)
Albumin: 3.4 g/dL — ABNORMAL LOW (ref 3.5–5.0)
Alkaline Phosphatase: 113 U/L (ref 38–126)
Anion gap: 10 (ref 5–15)
BUN: 21 mg/dL — ABNORMAL HIGH (ref 6–20)
CO2: 27 mmol/L (ref 22–32)
Calcium: 8.6 mg/dL — ABNORMAL LOW (ref 8.9–10.3)
Chloride: 106 mmol/L (ref 98–111)
Creatinine, Ser: 0.71 mg/dL (ref 0.44–1.00)
GFR, Estimated: >60 mL/min (ref >60)
Glucose, Bld: 145 mg/dL — ABNORMAL HIGH (ref 70–99)
Potassium: 3.9 mmol/L (ref 3.5–5.1)
Sodium: 143 mmol/L (ref 135–145)
Total Bilirubin: 0.5 mg/dL (ref 0.3–1.2)
Total Protein: 6.9 g/dL (ref 6.5–8.1)

## 2020-12-31 LAB — MAGNESIUM: Magnesium: 2.3 mg/dL (ref 1.7–2.4)

## 2020-12-31 LAB — FERRITIN: Ferritin: 847 ng/mL — ABNORMAL HIGH (ref 11–307)

## 2020-12-31 LAB — PHOSPHORUS: Phosphorus: 3.9 mg/dL (ref 2.5–4.6)

## 2020-12-31 LAB — FIBRIN DERIVATIVES D-DIMER (ARMC ONLY): Fibrin derivatives D-dimer (ARMC): 671.47 ng/mL (FEU) — ABNORMAL HIGH (ref 0.00–499.00)

## 2020-12-31 LAB — C-REACTIVE PROTEIN: CRP: 1.3 mg/dL — ABNORMAL HIGH (ref <1.0)

## 2020-12-31 NOTE — Progress Notes (Signed)
1        [place] at [facility]   PATIENT NAME: Suzanne Cox    MR#:  [id number]  DATE OF BIRTH:  [birth year]  SUBJECTIVE:  CHIEF COMPLAINT:   Chief Complaint  Patient presents with  . Shortness of Breath  Short of breath and cough.  Informed about her HIV status and patient is surprised to know.  She denies having any exposure and is puzzled to know she is positive for HIV REVIEW OF SYSTEMS:  Review of Systems  Constitutional: Negative for diaphoresis, fever, malaise/fatigue and weight loss.  HENT: Negative for ear discharge, ear pain, hearing loss, nosebleeds, sore throat and tinnitus.   Eyes: Negative for blurred vision and pain.  Respiratory: Positive for cough and shortness of breath. Negative for hemoptysis and wheezing.   Cardiovascular: Negative for chest pain, palpitations, orthopnea and leg swelling.  Gastrointestinal: Negative for abdominal pain, blood in stool, constipation, diarrhea, heartburn, nausea and vomiting.  Genitourinary: Negative for dysuria, frequency and urgency.  Musculoskeletal: Negative for back pain and myalgias.  Skin: Negative for itching and rash.  Neurological: Negative for dizziness, tingling, tremors, focal weakness, seizures, weakness and headaches.  Psychiatric/Behavioral: Negative for depression. The patient is not nervous/anxious.    DRUG ALLERGIES:   Allergies  Allergen Reactions  . Theophyllines Other (See Comments)    Headache  . Topamax [Topiramate] Itching  . Sulfa Antibiotics Rash  . Tetracyclines & Related Rash   VITALS:  Blood pressure 124/68, pulse 97, temperature 98.3 F (36.8 C), temperature source Oral, resp. rate 15, height 5\' 2"  (1.575 m), weight 88 kg, SpO2 93 %. PHYSICAL EXAMINATION:  Physical Exam HENT:     Head: Normocephalic and atraumatic.  Eyes:     Conjunctiva/sclera: Conjunctivae normal.     Pupils: Pupils are equal, round, and reactive to light.  Neck:     Thyroid: No thyromegaly.      Trachea: No tracheal deviation.  Cardiovascular:     Rate and Rhythm: Normal rate and regular rhythm.     Heart sounds: Normal heart sounds.  Pulmonary:     Effort: Pulmonary effort is normal. No respiratory distress.     Breath sounds: Decreased breath sounds present. No wheezing.  Chest:     Chest wall: No tenderness.  Abdominal:     General: Bowel sounds are normal. There is no distension.     Palpations: Abdomen is soft.     Tenderness: There is no abdominal tenderness.  Musculoskeletal:        General: Normal range of motion.     Cervical back: Normal range of motion and neck supple.  Skin:    General: Skin is warm and dry.     Findings: No rash.  Neurological:     Mental Status: She is alert and oriented to person, place, and time.     Cranial Nerves: No cranial nerve deficit.    LABORATORY PANEL:  Female CBC Recent Labs  Lab 12/31/20 0659  WBC 6.4  HGB 13.6  HCT 40.5  PLT 327   ------------------------------------------------------------------------------------------------------------------ Chemistries  Recent Labs  Lab 12/31/20 0659  NA 143  K 3.9  CL 106  CO2 27  GLUCOSE 145*  BUN 21*  CREATININE 0.71  CALCIUM 8.6*  MG 2.3  AST 16  ALT 58*  ALKPHOS 113  BILITOT 0.5   RADIOLOGY:  No results found. ASSESSMENT AND PLAN:  55 year old female with a known history of obesity, hypertension, GERD, depression and anxiety admitted for  worsening shortness of breath  Acute respiratory failure from pneumonia due to COVID-19 infection Now on room air Remdesivir day 3/5, continue steroids Antitussives and bronchodilators  Depression Lexapro  GERD Protonix  HIV Screening positive [facility] remotely monitoring and have ordered RNA quantitative so as to get her started on medication quickly  Obesity Body mass index is 35.48 kg/m.  Net IO Since Admission: 593.02 mL [12/31/20 1340]    1/20 -> COVID-19 pneumonia 1/21 -> on 2 l oxygen.  On remdesivir day  2/5 and steroids.  HIV screening + 1/22 -> patient surprised for her HIV +, she denies any exposure.  On room air now.  Day 3/5 of remdesivir     Level of care: Med-Surg  Status is: Inpatient  Remains inpatient appropriate because:IV treatments appropriate due to intensity of illness or inability to take PO   Dispo: The patient is from: Home              Anticipated d/c is to: Home              Anticipated d/c date is: 2 days              Patient currently is not medically stable to d/c.   Difficult to place patient No       DVT prophylaxis:       enoxaparin (LOVENOX) injection 40 mg Start: 12/29/20 1000     Family Communication:  "discussed with patient"   All the records are reviewed and case discussed with Care Management/Social Worker. Management plans discussed with the patient, nursing and they are in agreement.  CODE STATUS: Full Code   TOTAL TIME TAKING CARE OF THIS PATIENT: 35 minutes.   More than 50% of the time was spent in counseling/coordination of care: YES  POSSIBLE D/C IN 2 DAYS, DEPENDING ON CLINICAL CONDITION.   Delfino Lovett M.D on 12/31/2020 at 1:40 PM  [facility]   CC: Primary care physician; Rolm Gala, MD  Note: This dictation was prepared with Dragon dictation along with smaller phrase technology. Any transcriptional errors that result from this process are unintentional.

## 2021-01-01 LAB — CBC WITH DIFFERENTIAL/PLATELET
Abs Immature Granulocytes: 0.15 10*3/uL — ABNORMAL HIGH (ref 0.00–0.07)
Basophils Absolute: 0 10*3/uL (ref 0.0–0.1)
Basophils Relative: 0 %
Eosinophils Absolute: 0 10*3/uL (ref 0.0–0.5)
Eosinophils Relative: 0 %
HCT: 37.2 % (ref 36.0–46.0)
Hemoglobin: 12.5 g/dL (ref 12.0–15.0)
Immature Granulocytes: 2 %
Lymphocytes Relative: 13 %
Lymphs Abs: 1.3 10*3/uL (ref 0.7–4.0)
MCH: 29.3 pg (ref 26.0–34.0)
MCHC: 33.6 g/dL (ref 30.0–36.0)
MCV: 87.3 fL (ref 80.0–100.0)
Monocytes Absolute: 0.7 10*3/uL (ref 0.1–1.0)
Monocytes Relative: 8 %
Neutro Abs: 7.7 10*3/uL (ref 1.7–7.7)
Neutrophils Relative %: 77 %
Platelets: 362 10*3/uL (ref 150–400)
RBC: 4.26 MIL/uL (ref 3.87–5.11)
RDW: 12 % (ref 11.5–15.5)
WBC: 9.9 10*3/uL (ref 4.0–10.5)
nRBC: 0 % (ref 0.0–0.2)

## 2021-01-01 LAB — COMPREHENSIVE METABOLIC PANEL
ALT: 61 U/L — ABNORMAL HIGH (ref 0–44)
AST: 20 U/L (ref 15–41)
Albumin: 2.9 g/dL — ABNORMAL LOW (ref 3.5–5.0)
Alkaline Phosphatase: 102 U/L (ref 38–126)
Anion gap: 10 (ref 5–15)
BUN: 23 mg/dL — ABNORMAL HIGH (ref 6–20)
CO2: 25 mmol/L (ref 22–32)
Calcium: 8.2 mg/dL — ABNORMAL LOW (ref 8.9–10.3)
Chloride: 105 mmol/L (ref 98–111)
Creatinine, Ser: 0.7 mg/dL (ref 0.44–1.00)
GFR, Estimated: >60 mL/min (ref >60)
Glucose, Bld: 128 mg/dL — ABNORMAL HIGH (ref 70–99)
Potassium: 3.6 mmol/L (ref 3.5–5.1)
Sodium: 140 mmol/L (ref 135–145)
Total Bilirubin: 0.6 mg/dL (ref 0.3–1.2)
Total Protein: 5.8 g/dL — ABNORMAL LOW (ref 6.5–8.1)

## 2021-01-01 LAB — PHOSPHORUS: Phosphorus: 3.6 mg/dL (ref 2.5–4.6)

## 2021-01-01 LAB — FIBRIN DERIVATIVES D-DIMER (ARMC ONLY): Fibrin derivatives D-dimer (ARMC): 462.2 ng/mL (FEU) (ref 0.00–499.00)

## 2021-01-01 LAB — MAGNESIUM: Magnesium: 2.2 mg/dL (ref 1.7–2.4)

## 2021-01-01 LAB — C-REACTIVE PROTEIN: CRP: 0.7 mg/dL (ref <1.0)

## 2021-01-01 LAB — FERRITIN: Ferritin: 658 ng/mL — ABNORMAL HIGH (ref 11–307)

## 2021-01-01 MED ORDER — FUROSEMIDE 10 MG/ML IJ SOLN
40.0000 mg | Freq: Once | INTRAMUSCULAR | Status: AC
  Administered 2021-01-01: 40 mg via INTRAVENOUS
  Filled 2021-01-01: qty 4

## 2021-01-01 NOTE — Progress Notes (Signed)
Weened from 2L to RA over course of morning Patient Saturations on Room Air at Rest = 94% Patient Saturations on [employer] while Ambulating (100 feet in room) = 93%

## 2021-01-01 NOTE — Progress Notes (Signed)
1        [place] [facility]   PATIENT NAME: Suzanne Cox    MR#:  [id number]  DATE OF BIRTH:  11-16-1966  SUBJECTIVE:  CHIEF COMPLAINT:   Chief Complaint  Patient presents with  . Shortness of Breath  Cough +, shortness of breath improving REVIEW OF SYSTEMS:  Review of Systems  Constitutional: Negative for diaphoresis, fever, malaise/fatigue and weight loss.  HENT: Negative for ear discharge, ear pain, hearing loss, nosebleeds, sore throat and tinnitus.   Eyes: Negative for blurred vision and pain.  Respiratory: Positive for cough and shortness of breath. Negative for hemoptysis and wheezing.   Cardiovascular: Negative for chest pain, palpitations, orthopnea and leg swelling.  Gastrointestinal: Negative for abdominal pain, blood in stool, constipation, diarrhea, heartburn, nausea and vomiting.  Genitourinary: Negative for dysuria, frequency and urgency.  Musculoskeletal: Negative for back pain and myalgias.  Skin: Negative for itching and rash.  Neurological: Negative for dizziness, tingling, tremors, focal weakness, seizures, weakness and headaches.  Psychiatric/Behavioral: Negative for depression. The patient is not nervous/anxious.    DRUG ALLERGIES:   Allergies  Allergen Reactions  . Theophyllines Other (See Comments)    Headache  . Topamax [Topiramate] Itching  . Sulfa Antibiotics Rash  . Tetracyclines & Related Rash   VITALS:  Blood pressure 116/64, pulse 84, temperature 98.1 F (36.7 C), resp. rate 15, height 5\' 2"  (1.575 m), weight 88 kg, SpO2 93 %. PHYSICAL EXAMINATION:  Physical Exam HENT:     Head: Normocephalic and atraumatic.  Eyes:     Conjunctiva/sclera: Conjunctivae normal.     Pupils: Pupils are equal, round, and reactive to light.  Neck:     Thyroid: No thyromegaly.     Trachea: No tracheal deviation.  Cardiovascular:     Rate and Rhythm: Normal rate and regular rhythm.     Heart sounds: Normal heart sounds.  Pulmonary:      Effort: Pulmonary effort is normal. No respiratory distress.     Breath sounds: Decreased breath sounds present. No wheezing.  Chest:     Chest wall: No tenderness.  Abdominal:     General: Bowel sounds are normal. There is no distension.     Palpations: Abdomen is soft.     Tenderness: There is no abdominal tenderness.  Musculoskeletal:        General: Normal range of motion.     Cervical back: Normal range of motion and neck supple.  Skin:    General: Skin is warm and dry.     Findings: No rash.  Neurological:     Mental Status: She is alert and oriented to person, place, and time.     Cranial Nerves: No cranial nerve deficit.    LABORATORY PANEL:  Female CBC Recent Labs  Lab 01/01/21 0621  WBC 9.9  HGB 12.5  HCT 37.2  PLT 362   ------------------------------------------------------------------------------------------------------------------ Chemistries  Recent Labs  Lab 01/01/21 0621  NA 140  K 3.6  CL 105  CO2 25  GLUCOSE 128*  BUN 23*  CREATININE 0.70  CALCIUM 8.2*  MG 2.2  AST 20  ALT 61*  ALKPHOS 102  BILITOT 0.6   RADIOLOGY:  No results found. ASSESSMENT AND PLAN:  55 year old female with a known history of obesity, hypertension, GERD, depression and anxiety admitted for worsening shortness of breath  Acute respiratory failure from pneumonia due to COVID-19 infection Now on room air Remdesivir day 4/5, continue steroids Antitussives and bronchodilators  Depression Lexapro  GERD Protonix  HIV Screening positive [facility] remotely monitoring and have ordered RNA quantitative so as to get her started on medication quickly  Obesity Body mass index is 35.48 kg/m.  Net IO Since Admission: 593.02 mL [01/01/21 1446]    1/20 -> COVID-19 pneumonia 1/21 -> on 2 l oxygen.  On remdesivir day 2/5 and steroids.  HIV screening + 1/22 -> patient surprised for her HIV +, she denies any exposure.  On room air now.  Day 3/5 of remdesivir 1/23 -> on 2 L  nasal cannula.  Remdesivir day 4/5     Level of care: Med-Surg  Status is: Inpatient  Remains inpatient appropriate because:IV treatments appropriate due to intensity of illness or inability to take PO   Dispo: The patient is from: Home              Anticipated d/c is to: Home              Anticipated d/c date is: 1 day              Patient currently is not medically stable to d/c.   Difficult to place patient No   DVT prophylaxis:       enoxaparin (LOVENOX) injection 40 mg Start: 12/29/20 1000     Family Communication:  "discussed with patient"   All the records are reviewed and case discussed with Care Management/Social Worker. Management plans discussed with the patient, nursing and they are in agreement.  CODE STATUS: Full Code   TOTAL TIME TAKING CARE OF THIS PATIENT: 35 minutes.   More than 50% of the time was spent in counseling/coordination of care: YES  POSSIBLE D/C IN 1 DAYS, DEPENDING ON CLINICAL CONDITION.   Delfino Lovett M.D on 01/01/2021 at 2:46 PM  [facility]   CC: Primary care physician; Rolm Gala, MD  Note: This dictation was prepared with Dragon dictation along with smaller phrase technology. Any transcriptional errors that result from this process are unintentional.

## 2021-01-01 NOTE — Plan of Care (Signed)
  Problem: Education: Goal: Knowledge of General Education information will improve Description: Including pain rating scale, medication(s)/side effects and non-pharmacologic comfort measures Outcome: Progressing   Problem: Health Behavior/Discharge Planning: Goal: Ability to manage health-related needs will improve Outcome: Progressing   Problem: Clinical Measurements: Goal: Ability to maintain clinical measurements within normal limits will improve Outcome: Progressing Goal: Diagnostic test results will improve Outcome: Progressing Goal: Respiratory complications will improve Outcome: Progressing Goal: Cardiovascular complication will be avoided Outcome: Progressing   Problem: Activity: Goal: Risk for activity intolerance will decrease Outcome: Progressing   Problem: Nutrition: Goal: Adequate nutrition will be maintained Outcome: Progressing   Problem: Coping: Goal: Level of anxiety will decrease Outcome: Progressing   Problem: Education: Goal: Knowledge of risk factors and measures for prevention of condition will improve Outcome: Progressing   Problem: Coping: Goal: Psychosocial and spiritual needs will be supported Outcome: Progressing   Problem: Respiratory: Goal: Will maintain a patent airway Outcome: Progressing Goal: Complications related to the disease process, condition or treatment will be avoided or minimized Outcome: Progressing

## 2021-01-02 DIAGNOSIS — J9601 Acute respiratory failure with hypoxia: Secondary | ICD-10-CM

## 2021-01-02 LAB — COMPREHENSIVE METABOLIC PANEL
ALT: 112 U/L — ABNORMAL HIGH (ref 0–44)
AST: 24 U/L (ref 15–41)
Albumin: 3.3 g/dL — ABNORMAL LOW (ref 3.5–5.0)
Alkaline Phosphatase: 119 U/L (ref 38–126)
Anion gap: 10 (ref 5–15)
BUN: 21 mg/dL — ABNORMAL HIGH (ref 6–20)
CO2: 28 mmol/L (ref 22–32)
Calcium: 8.2 mg/dL — ABNORMAL LOW (ref 8.9–10.3)
Chloride: 101 mmol/L (ref 98–111)
Creatinine, Ser: 0.74 mg/dL (ref 0.44–1.00)
GFR, Estimated: >60 mL/min (ref >60)
Glucose, Bld: 151 mg/dL — ABNORMAL HIGH (ref 70–99)
Potassium: 3.7 mmol/L (ref 3.5–5.1)
Sodium: 139 mmol/L (ref 135–145)
Total Bilirubin: 0.7 mg/dL (ref 0.3–1.2)
Total Protein: 6.5 g/dL (ref 6.5–8.1)

## 2021-01-02 LAB — CBC WITH DIFFERENTIAL/PLATELET
Abs Immature Granulocytes: 0.19 10*3/uL — ABNORMAL HIGH (ref 0.00–0.07)
Basophils Absolute: 0 10*3/uL (ref 0.0–0.1)
Basophils Relative: 0 %
Eosinophils Absolute: 0 10*3/uL (ref 0.0–0.5)
Eosinophils Relative: 0 %
HCT: 39.9 % (ref 36.0–46.0)
Hemoglobin: 13.5 g/dL (ref 12.0–15.0)
Immature Granulocytes: 2 %
Lymphocytes Relative: 13 %
Lymphs Abs: 1.3 10*3/uL (ref 0.7–4.0)
MCH: 29.3 pg (ref 26.0–34.0)
MCHC: 33.8 g/dL (ref 30.0–36.0)
MCV: 86.6 fL (ref 80.0–100.0)
Monocytes Absolute: 0.8 10*3/uL (ref 0.1–1.0)
Monocytes Relative: 8 %
Neutro Abs: 7.2 10*3/uL (ref 1.7–7.7)
Neutrophils Relative %: 77 %
Platelets: 427 10*3/uL — ABNORMAL HIGH (ref 150–400)
RBC: 4.61 MIL/uL (ref 3.87–5.11)
RDW: 11.7 % (ref 11.5–15.5)
WBC: 9.5 10*3/uL (ref 4.0–10.5)
nRBC: 0 % (ref 0.0–0.2)

## 2021-01-02 LAB — MAGNESIUM: Magnesium: 2.2 mg/dL (ref 1.7–2.4)

## 2021-01-02 LAB — FIBRIN DERIVATIVES D-DIMER (ARMC ONLY): Fibrin derivatives D-dimer (ARMC): 403.3 ng/mL (FEU) (ref 0.00–499.00)

## 2021-01-02 LAB — FERRITIN: Ferritin: 853 ng/mL — ABNORMAL HIGH (ref 11–307)

## 2021-01-02 LAB — C-REACTIVE PROTEIN: CRP: 0.7 mg/dL (ref <1.0)

## 2021-01-02 LAB — PHOSPHORUS: Phosphorus: 3.9 mg/dL (ref 2.5–4.6)

## 2021-01-02 MED ORDER — ZINC SULFATE 220 (50 ZN) MG PO CAPS: 220.0000 mg | ORAL_CAPSULE | Freq: Every day | ORAL | 0 refills | Status: AC

## 2021-01-02 MED ORDER — ASCORBIC ACID 500 MG PO TABS: 500.0000 mg | ORAL_TABLET | Freq: Every day | ORAL | 0 refills | Status: AC

## 2021-01-02 NOTE — Discharge Instructions (Signed)
10 Things You Can Do to Manage Your COVID-19 Symptoms at Home °If you have possible or confirmed COVID-19: °1. Stay home except to get medical care. °2. Monitor your symptoms carefully. If your symptoms get worse, call your healthcare provider immediately. °3. Get rest and stay hydrated. °4. If you have a medical appointment, call the healthcare provider ahead of time and tell them that you have or may have COVID-19. °5. For medical emergencies, call 911 and notify the dispatch personnel that you have or may have COVID-19. °6. Cover your cough and sneezes with a tissue or use the inside of your elbow. °7. Wash your hands often with soap and water for at least 20 seconds or clean your hands with an alcohol-based hand sanitizer that contains at least 60% alcohol. °8. As much as possible, stay in a specific room and away from other people in your home. Also, you should use a separate bathroom, if available. If you need to be around other people in or outside of the home, wear a mask. °9. Avoid sharing personal items with other people in your household, like dishes, towels, and bedding. °10. Clean all surfaces that are touched often, like counters, tabletops, and doorknobs. Use household cleaning sprays or wipes according to the label instructions. °cdc.gov/coronavirus °06/24/2020 °This information is not intended to replace advice given to you by your health care provider. Make sure you discuss any questions you have with your health care provider. °Document Revised: 10/10/2020 Document Reviewed: 10/10/2020 °Elsevier Patient Education © 2021 Elsevier Inc. ° °COVID-19 Quarantine vs. Isolation °QUARANTINE keeps someone who was in close contact with someone who has COVID-19 away from others. °Quarantine if you have been in close contact with someone who has COVID-19, unless you have been fully vaccinated. °If you are fully vaccinated °· You do NOT need to quarantine unless they have symptoms °· Get tested 3-5 days after  your exposure, even if you don't have symptoms °· Wear a mask indoors in public for 14 days following exposure or until your test result is negative °If you are not fully vaccinated °· Stay home for 14 days after your last contact with a person who has COVID-19 °· Watch for fever (100.4°F), cough, shortness of breath, or other symptoms of COVID-19 °· If possible, stay away from people you live with, especially people who are at higher risk for getting very sick from COVID-19 °· Contact your local public health department for options in your area to possibly shorten your quarantine °ISOLATION keeps someone who is sick or tested positive for COVID-19 without symptoms away from others, even in their own home. °People who are in isolation should stay home and stay in a specific "sick room" or area and use a separate bathroom (if available). °If you are sick and think or know you have COVID-19 °Stay home until after °· At least 10 days since symptoms first appeared and °· At least 24 hours with no fever without the use of fever-reducing medications and °· Symptoms have improved °If you tested positive for COVID-19 but do not have symptoms °· Stay home until after 10 days have passed since your positive viral test °· If you develop symptoms after testing positive, follow the steps above for those who are sick °cdc.gov/coronavirus °09/05/2020 °This information is not intended to replace advice given to you by your health care provider. Make sure you discuss any questions you have with your health care provider. °Document Revised: 10/10/2020 Document Reviewed: 10/10/2020 °Elsevier Patient Education ©   2021 Elsevier Inc.    Person Under Monitoring Name: Suzanne Cox  Location: [address] [place] [address]   Infection Prevention Recommendations for Individuals Confirmed to have, or Being Evaluated for, 2019 Novel Coronavirus (COVID-19) Infection Who Receive Care at Home  Individuals who are  confirmed to have, or are being evaluated for, COVID-19 should follow the prevention steps below until a healthcare provider or local or state health department says they can return to normal activities.  Stay home except to get medical care You should restrict activities outside your home, except for getting medical care. Do not go to work, school, or public areas, and do not use public transportation or taxis.  Call ahead before visiting your doctor Before your medical appointment, call the healthcare provider and tell them that you have, or are being evaluated for, COVID-19 infection. This will help the healthcare providers office take steps to keep other people from getting infected. Ask your healthcare provider to call the local or state health department.  Monitor your symptoms Seek prompt medical attention if your illness is worsening (e.g., difficulty breathing). Before going to your medical appointment, call the healthcare provider and tell them that you have, or are being evaluated for, COVID-19 infection. Ask your healthcare provider to call the local or state health department.  Wear a facemask You should wear a facemask that covers your nose and mouth when you are in the same room with other people and when you visit a healthcare provider. People who live with or visit you should also wear a facemask while they are in the same room with you.  Separate yourself from other people in your home As much as possible, you should stay in a different room from other people in your home. Also, you should use a separate bathroom, if available.  Avoid sharing household items You should not share dishes, drinking glasses, cups, eating utensils, towels, bedding, or other items with other people in your home. After using these items, you should wash them thoroughly with soap and water.  Cover your coughs and sneezes Cover your mouth and nose with a tissue when you cough or sneeze, or  you can cough or sneeze into your sleeve. Throw used tissues in a lined trash can, and immediately wash your hands with soap and water for at least 20 seconds or use an alcohol-based hand rub.  Wash your [employer] your hands often and thoroughly with soap and water for at least 20 seconds. You can use an alcohol-based hand sanitizer if soap and water are not available and if your hands are not visibly dirty. Avoid touching your eyes, nose, and mouth with unwashed hands.   Prevention Steps for Caregivers and Household Members of Individuals Confirmed to have, or Being Evaluated for, COVID-19 Infection Being Cared for in the Home  If you live with, or provide care at home for, a person confirmed to have, or being evaluated for, COVID-19 infection please follow these guidelines to prevent infection:  Follow healthcare providers instructions Make sure that you understand and can help the patient follow any healthcare provider instructions for all care.  Provide for the patients basic needs You should help the patient with basic needs in the home and provide support for getting groceries, prescriptions, and other personal needs.  Monitor the patients symptoms If they are getting sicker, call his or her medical provider and tell them that the patient has, or is being evaluated for, COVID-19  infection. This will help the healthcare providers office take steps to keep other people from getting infected. Ask the healthcare provider to call the local or state health department.  Limit the number of people who have contact with the patient  If possible, have only one caregiver for the patient.  Other household members should stay in another home or place of residence. If this is not possible, they should stay  in another room, or be separated from the patient as much as possible. Use a separate bathroom, if available.  Restrict visitors who do not have an essential need to be in the  home.  Keep older adults, very young children, and other sick people away from the patient Keep older adults, very young children, and those who have compromised immune systems or chronic health conditions away from the patient. This includes people with chronic heart, lung, or kidney conditions, diabetes, and cancer.  Ensure good ventilation Make sure that shared spaces in the home have good air flow, such as from an air conditioner or an opened window, weather permitting.  Wash your hands often  Wash your hands often and thoroughly with soap and water for at least 20 seconds. You can use an alcohol based hand sanitizer if soap and water are not available and if your hands are not visibly dirty.  Avoid touching your eyes, nose, and mouth with unwashed hands.  Use disposable paper towels to dry your hands. If not available, use dedicated cloth towels and replace them when they become wet.  Wear a facemask and gloves  Wear a disposable facemask at all times in the room and gloves when you touch or have contact with the patients blood, body fluids, and/or secretions or excretions, such as sweat, saliva, sputum, nasal mucus, vomit, urine, or feces.  Ensure the mask fits over your nose and mouth tightly, and do not touch it during use.  Throw out disposable facemasks and gloves after using them. Do not reuse.  Wash your hands immediately after removing your facemask and gloves.  If your personal clothing becomes contaminated, carefully remove clothing and launder. Wash your hands after handling contaminated clothing.  Place all used disposable facemasks, gloves, and other waste in a lined container before disposing them with other household waste.  Remove gloves and wash your hands immediately after handling these items.  Do not share dishes, glasses, or other household items with the patient  Avoid sharing household items. You should not share dishes, drinking glasses, cups, eating  utensils, towels, bedding, or other items with a patient who is confirmed to have, or being evaluated for, COVID-19 infection.  After the person uses these items, you should wash them thoroughly with soap and water.  Wash laundry thoroughly  Immediately remove and wash clothes or bedding that have blood, body fluids, and/or secretions or excretions, such as sweat, saliva, sputum, nasal mucus, vomit, urine, or feces, on them.  Wear gloves when handling laundry from the patient.  Read and follow directions on labels of laundry or clothing items and detergent. In general, wash and dry with the warmest temperatures recommended on the label.  Clean all areas the individual has used often  Clean all touchable surfaces, such as counters, tabletops, doorknobs, bathroom fixtures, toilets, phones, keyboards, tablets, and bedside tables, every day. Also, clean any surfaces that may have blood, body fluids, and/or secretions or excretions on them.  Wear gloves when cleaning surfaces the patient has come in contact with.  Use a diluted  bleach solution (e.g., dilute bleach with 1 part bleach and 10 parts water) or a household disinfectant with a label that says EPA-registered for coronaviruses. To make a bleach solution at home, add 1 tablespoon of bleach to 1 quart (4 cups) of water. For a larger supply, add  cup of bleach to 1 gallon (16 cups) of water.  Read labels of cleaning products and follow recommendations provided on product labels. Labels contain instructions for safe and effective use of the cleaning product including precautions you should take when applying the product, such as wearing gloves or eye protection and making sure you have good ventilation during use of the product.  Remove gloves and wash hands immediately after cleaning.  Monitor yourself for signs and symptoms of illness Caregivers and household members are considered close contacts, should monitor their health, and will be  asked to limit movement outside of the home to the extent possible. Follow the monitoring steps for close contacts listed on the symptom monitoring form.   ? If you have additional questions, contact your local health department or call the epidemiologist on call at [phone] (available 24/7). ? This guidance is subject to change. For the most up-to-date guidance from [facility], please refer to their website: TripMetro.hu   Person Under Monitoring Name: Suzanne Cox  Location: [address] [place] [address]   CORONAVIRUS DISEASE 2019 (COVID-19) Guidance for Persons Under Investigation You are being tested for the virus that causes coronavirus disease 2019 (COVID-19). Public health actions are necessary to ensure protection of your health and the health of others, and to prevent further spread of infection. COVID-19 is caused by a virus that can cause symptoms, such as fever, cough, and shortness of breath. The primary transmission from person to person is by coughing or sneezing. On January 08, 2019, the World Health Organization announced a [employer] Emergency of International Concern and on January 09, 2019 the U.S. Department of Health and Human Services declared a public health emergency. If the virus that causesCOVID-19 spreads in the community, it could have severe public health consequences.  As a person under investigation for COVID-19, the [employer] of Health and [employer], Division of [employer] advises you to adhere to the following guidance until your test results are reported to you. If your test result is positive, you will receive additional information from your provider and your local health department at that time.   Remain at home until you are cleared by your health provider or public health authorities.   Keep a log of visitors to your home using the form provided. Any  visitors to your home must be aware of your isolation status.  If you plan to move to a new address or leave the county, notify the local health department in your county.  Call a doctor or seek care if you have an urgent medical need. Before seeking medical care, call ahead and get instructions from the provider before arriving at the medical office, clinic or hospital. Notify them that you are being tested for the virus that causes COVID-19 so arrangements can be made, as necessary, to prevent transmission to others in the healthcare setting. Next, notify the local health department in your county.  If a medical emergency arises and you need to call 911, inform the first responders that you are being tested for the virus that causes COVID-19. Next, notify the local health department in your county.  Adhere to all  guidance set forth by the [facility] Division of Public Health for [facility] of patients that is based on guidance from the Center for Disease Control and Prevention with suspected or confirmed COVID-19. It is provided with this guidance for Persons Under Investigation.  Your health and the health of our community are our top priorities. Public Health officials remain available to provide assistance and counseling to you about COVID-19 and compliance with this guidance.  Provider: ____________________________________________________________ Date: ______/_____/_________  By signing below, you acknowledge that you have read and agree to comply with this Guidance for Persons Under Investigation. ______________________________________________________________ Date: ______/_____/_________  WHO DO I CALL? You can find a list of local health departments here: http://dean.org/https://www.ncdhhs.gov/divisions/public-health/county-healthdepartments Health Department: ____________________________________________________________________ Contact Name:  ________________________________________________________________________ Telephone: ___________________________________________________________________________  [place] DHHS, Division of Public Health, Communicable Disease Branch COVID-19 Guidance for Persons Under Investigation February 14, 2019

## 2021-01-03 LAB — HIV-1 RNA QUANT-NO REFLEX-BLD
HIV 1 RNA Quant: <20 copies/mL
LOG10 HIV-1 RNA: UNDETERMINED log10copy/mL

## 2021-01-04 LAB — HIV-1/2 AB - DIFFERENTIATION
HIV 1 Ab: NONREACTIVE
HIV 2 Ab: NONREACTIVE
Note: NEGATIVE

## 2021-01-04 NOTE — Progress Notes (Signed)
Patient is notified of false positive HIV result at [phone]

## 2021-01-04 NOTE — Progress Notes (Signed)
  ID HIV RNA < 20- resulted on 01/03/21 HIV confirmatory ( 1 & 2) antibody  test negative So the preliminary test was false positive Pt does not have HIV

## 2021-01-04 NOTE — Discharge Summary (Signed)
5        [facility]   PATIENT NAME: Suzanne Cox    MR#:  [id number]  DATE OF BIRTH:  03/22/1966  DATE OF ADMISSION:  12/29/2020   ADMITTING PHYSICIAN: Lucile Shutters, MD  DATE OF DISCHARGE: 01/02/2021  3:07 PM  PRIMARY CARE PHYSICIAN: Rolm Gala, MD   ADMISSION DIAGNOSIS:  Acute hypoxemic respiratory failure due to COVID-19 (HCC) [U07.1, J96.01] Pneumonia due to COVID-19 virus [U07.1, J12.82] DISCHARGE DIAGNOSIS:  Principal Problem:   Pneumonia due to COVID-19 virus Active Problems:   Acid reflux   Hypertension   Anxiety   Thyroid disease   Acute hypoxemic respiratory failure due to COVID-19 [facility])  SECONDARY DIAGNOSIS:   Past Medical History:  Diagnosis Date  . Acid reflux   . Anxiety   . Hypertension   . Thyroid disease    HOSPITAL COURSE:  55 year old female with a known history of obesity, hypertension, GERD, depression and anxiety admitted for worsening shortness of breath  Acute hypoxic respiratory failure from pneumonia due to COVID-19 infection Completed course of remdesivir and treated with steroids while in the hospital.  Now on room air at discharge  Depression Lexapro  GERD Protonix  HIV -ruled out Screening positive.  HIV 1 and 2 antibodies negative False positive test  Obesity Body mass index is 35.48 kg/m.   DISCHARGE CONDITIONS:  Stable CONSULTS OBTAINED:   DRUG ALLERGIES:   Allergies  Allergen Reactions  . Theophyllines Other (See Comments)    Headache  . Topamax [Topiramate] Itching  . Sulfa Antibiotics Rash  . Tetracyclines & Related Rash   DISCHARGE MEDICATIONS:   Allergies as of 01/02/2021      Reactions   Theophyllines Other (See Comments)   Headache   Topamax [topiramate] Itching   Sulfa Antibiotics Rash   Tetracyclines & Related Rash      Medication List    TAKE these medications   amLODipine 5 MG tablet Commonly known as: NORVASC Take 5 mg by mouth daily.   ascorbic  acid 500 MG tablet Commonly known as: VITAMIN C Take 1 tablet (500 mg total) by mouth daily.   butalbital-acetaminophen-caffeine 50-325-40 MG tablet Commonly known as: FIORICET Take 1 tablet by mouth every 4 (four) hours as needed for headache.   cetirizine 10 MG tablet Commonly known as: ZYRTEC Take 10 mg by mouth daily at 12 noon.   escitalopram 20 MG tablet Commonly known as: LEXAPRO Take 20 mg by mouth daily.   levothyroxine 75 MCG tablet Commonly known as: SYNTHROID Take 75 mcg by mouth daily.   pantoprazole 40 MG tablet Commonly known as: PROTONIX Take 40 mg by mouth daily.   zinc sulfate 220 (50 Zn) MG capsule Take 1 capsule (220 mg total) by mouth daily.      DISCHARGE INSTRUCTIONS:   DIET:  Regular diet DISCHARGE CONDITION:  Stable ACTIVITY:  Activity as tolerated OXYGEN:  Home Oxygen: No.  Oxygen Delivery: room air DISCHARGE LOCATION:  home   If you experience worsening of your admission symptoms, develop shortness of breath, life threatening emergency, suicidal or homicidal thoughts you must seek medical attention immediately by calling 911 or calling your MD immediately  if symptoms less severe.  You Must read complete instructions/literature along with all the possible adverse reactions/side effects for all the Medicines you take and that have been prescribed to you. Take any new Medicines after you have completely understood and accpet all the possible adverse reactions/side effects.   Please note  You were cared for by a hospitalist during your hospital stay. If you have any questions about your discharge medications or the care you received while you were in the hospital after you are discharged, you can call the unit and asked to speak with the hospitalist on call if the hospitalist that took care of you is not available. Once you are discharged, your primary care physician will handle any further medical issues. Please note that NO REFILLS for any  discharge medications will be authorized once you are discharged, as it is imperative that you return to your primary care physician (or establish a relationship with a primary care physician if you do not have one) for your aftercare needs so that they can reassess your need for medications and monitor your lab values.    On the day of Discharge:  VITAL SIGNS:  Blood pressure 127/71, pulse 88, temperature 98.2 F (36.8 C), resp. rate 16, height 5\' 2"  (1.575 m), weight 88 kg, SpO2 94 %. PHYSICAL EXAMINATION:  GENERAL:  55 y.o.-year-old patient lying in the bed with no acute distress.  EYES: Pupils equal, round, reactive to light and accommodation. No scleral icterus. Extraocular muscles intact.  HEENT: Head atraumatic, normocephalic. Oropharynx and nasopharynx clear.  NECK:  Supple, no jugular venous distention. No thyroid enlargement, no tenderness.  LUNGS: Normal breath sounds bilaterally, no wheezing, rales,rhonchi or crepitation. No use of accessory muscles of respiration.  CARDIOVASCULAR: S1, S2 normal. No murmurs, rubs, or gallops.  ABDOMEN: Soft, non-tender, non-distended. Bowel sounds present. No organomegaly or mass.  EXTREMITIES: No pedal edema, cyanosis, or clubbing.  NEUROLOGIC: Cranial nerves II through XII are intact. Muscle strength 5/5 in all extremities. Sensation intact. Gait not checked.  PSYCHIATRIC: The patient is alert and oriented x 3.  SKIN: No obvious rash, lesion, or ulcer.  DATA REVIEW:   CBC Recent Labs  Lab 01/02/21 0223  WBC 9.5  HGB 13.5  HCT 39.9  PLT 427*    Chemistries  Recent Labs  Lab 01/02/21 0223  NA 139  K 3.7  CL 101  CO2 28  GLUCOSE 151*  BUN 21*  CREATININE 0.74  CALCIUM 8.2*  MG 2.2  AST 24  ALT 112*  ALKPHOS 119  BILITOT 0.7     Outpatient follow-up  Follow-up Information    01/04/21, MD. Schedule an appointment as soon as possible for a visit in 1 week.   Specialty: Family Medicine Contact information: [address] [place] [phone]               30 Day Unplanned Readmission Risk Score   Flowsheet Row ED to Hosp-Admission (Discharged) from 12/29/2020 in [facility]  30 Day Unplanned Readmission Risk Score (%) 14.44 Filed at 01/02/2021 1200     This score is the patient's risk of an unplanned readmission within 30 days of being discharged (0 -100%). The score is based on dignosis, age, lab data, medications, orders, and past utilization.   Low:  0-14.9   Medium: 15-21.9   High: 22-29.9   Extreme: 30 and above         Management plans discussed with the patient, family and they are in agreement.  CODE STATUS: Prior   TOTAL TIME TAKING CARE OF THIS PATIENT: 45 minutes.    01/04/2021 M.D on 01/04/2021 at 5:11 PM  [facility]   CC: Primary care physician; 01/06/2021, MD   Note: This dictation was prepared  with Dragon dictation along with smaller phrase technology. Any transcriptional errors that result from this process are unintentional.

## 2021-09-21 ENCOUNTER — Ambulatory Visit: Payer: BC Managed Care – PPO | Admitting: Dermatology

## 2021-10-06 IMAGING — CR DG CHEST 2V
1 series · 2 of 2 positions shown · non-contrast
Comparison: None.

CLINICAL DATA: COVID positive with hypoxia and shortness of breath.

EXAM:
CHEST - 2 VIEW

[Series 1: dg chest 2 view · 0.14mm/px · 2 of 2 slices shown]
[im 1/2]
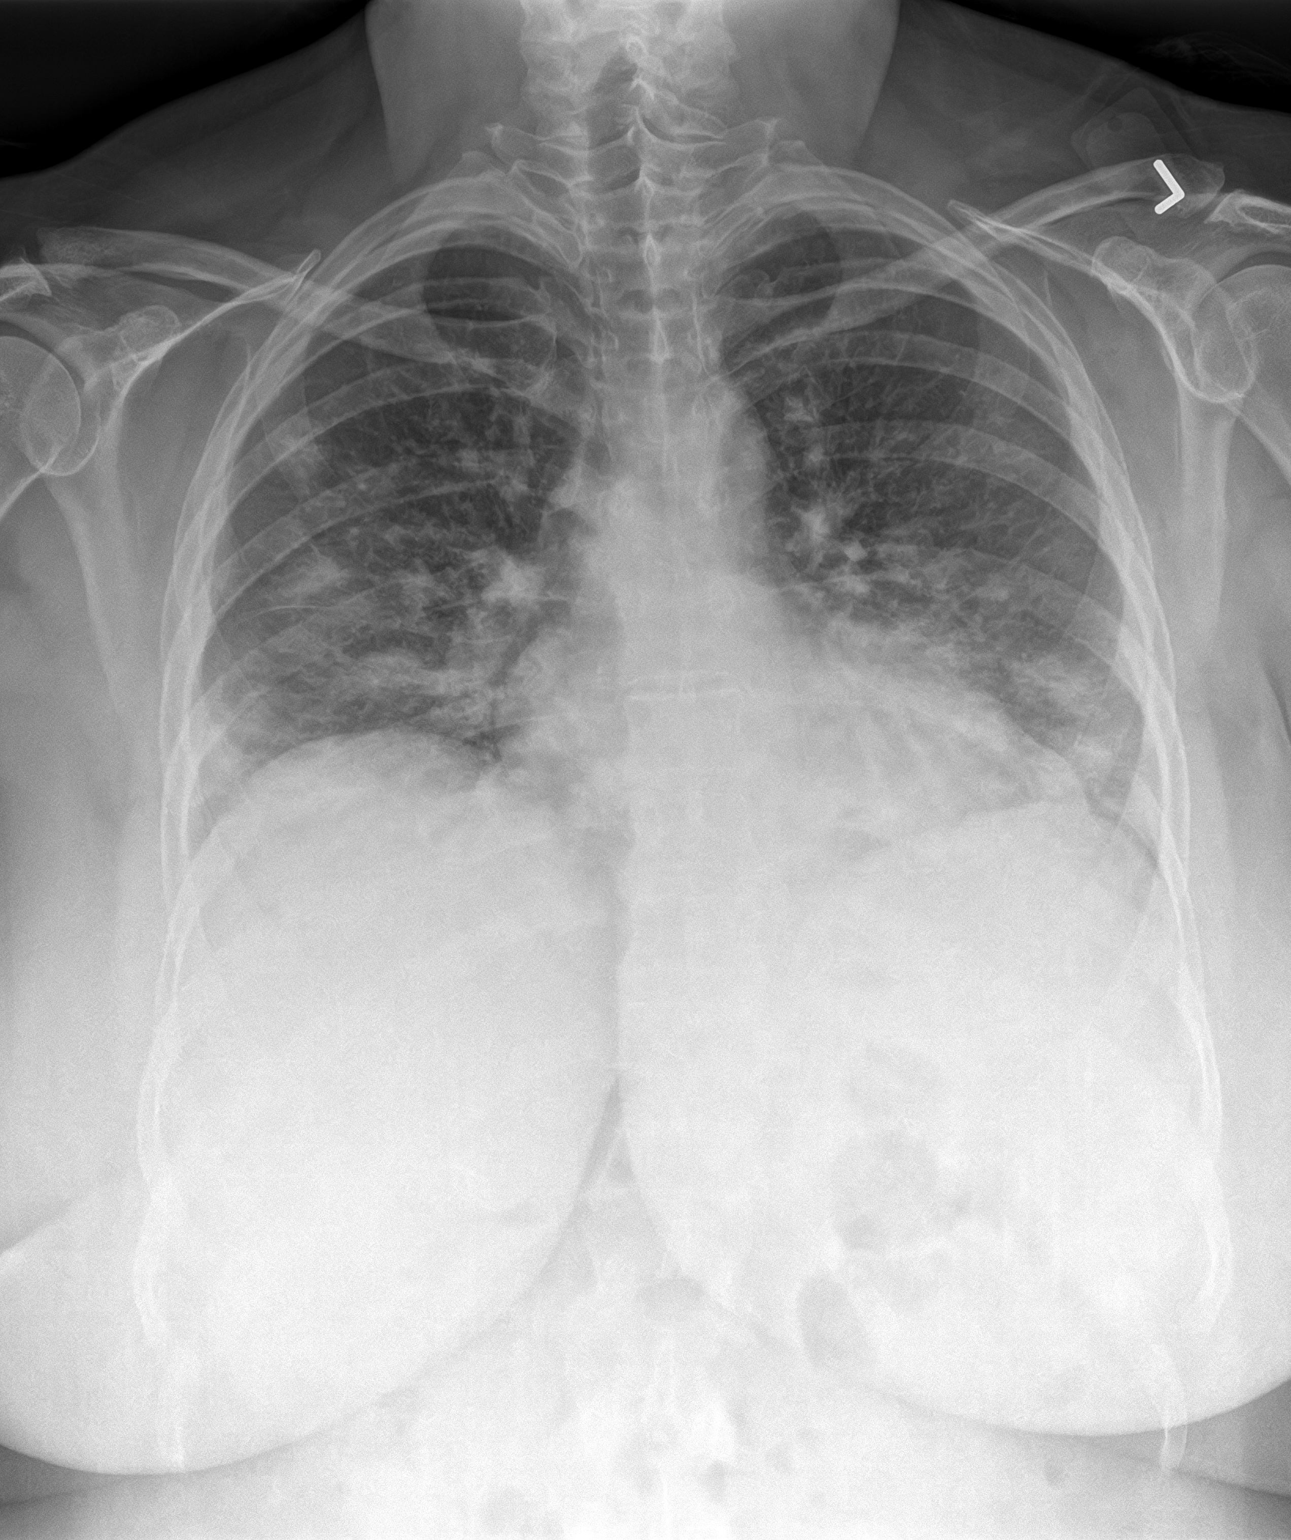
[im 2/2]
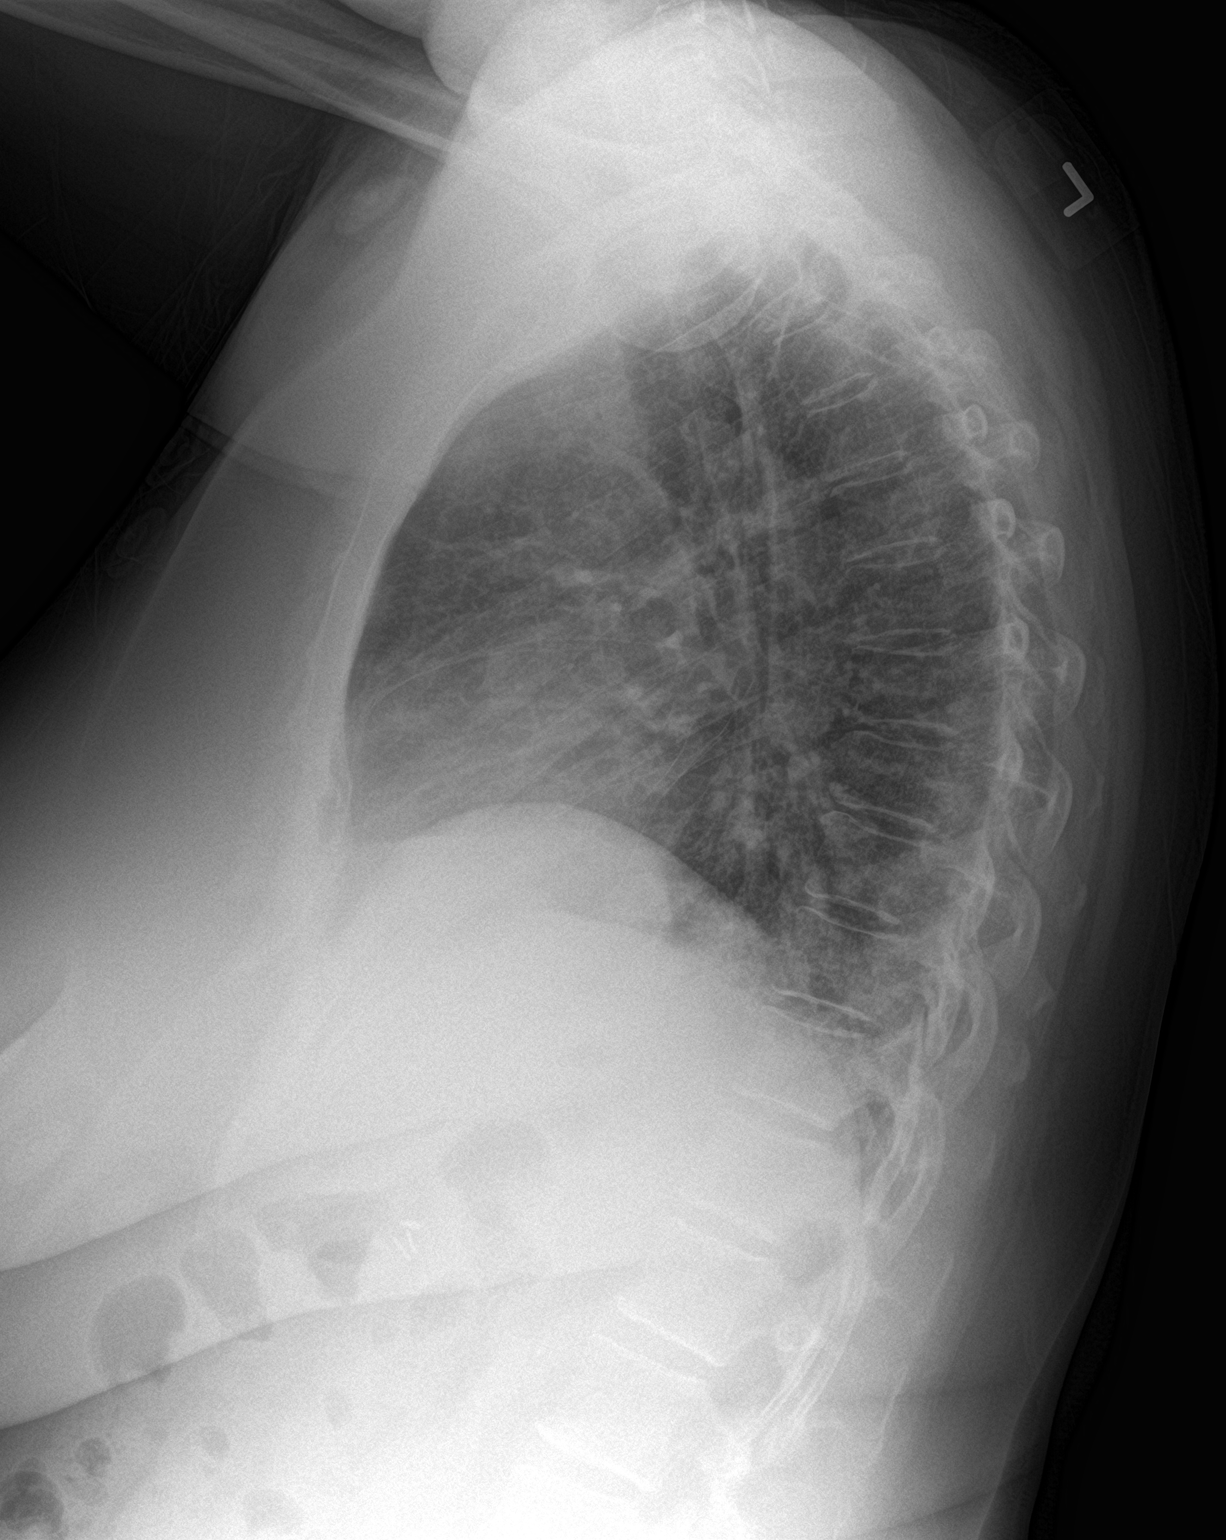

[2 of 2 positions shown; findings below may reference images not displayed]

FINDINGS: Decreased lung volumes are seen which is likely secondary to the
degree of patient inspiration. Moderate severity multifocal
infiltrates are seen within the mid and lower right lung and left
lung base. There is no evidence of a pleural effusion or
pneumothorax. The heart size and mediastinal contours are within
normal limits. The visualized skeletal structures are unremarkable.
Radiopaque surgical clips are seen within the right upper quadrant.
IMPRESSION: Moderate severity bilateral multifocal infiltrates, right greater
than left.

## 2023-07-03 ENCOUNTER — Other Ambulatory Visit: Payer: Self-pay

## 2023-07-03 ENCOUNTER — Ambulatory Visit
Admission: EM | Admit: 2023-07-03 | Discharge: 2023-07-03 | Disposition: A | Discharge status: Home / Self Care | Payer: BC Managed Care – PPO | Source: Home / Self Care | Attending: Family Medicine | Admitting: Family Medicine

## 2023-07-03 ENCOUNTER — Observation Stay
Admission: EM | Admit: 2023-07-03 | Discharge: 2023-07-04 | Disposition: A | Discharge status: Home / Self Care | Payer: BC Managed Care – PPO | Attending: Internal Medicine | Admitting: Internal Medicine

## 2023-07-03 ENCOUNTER — Emergency Department: Payer: BC Managed Care – PPO

## 2023-07-03 DIAGNOSIS — R079 Chest pain, unspecified: Principal | ICD-10-CM | POA: Diagnosis present

## 2023-07-03 DIAGNOSIS — R0789 Other chest pain: Principal | ICD-10-CM | POA: Insufficient documentation

## 2023-07-03 DIAGNOSIS — G43909 Migraine, unspecified, not intractable, without status migrainosus: Secondary | ICD-10-CM | POA: Insufficient documentation

## 2023-07-03 DIAGNOSIS — E039 Hypothyroidism, unspecified: Secondary | ICD-10-CM | POA: Diagnosis not present

## 2023-07-03 DIAGNOSIS — K219 Gastro-esophageal reflux disease without esophagitis: Secondary | ICD-10-CM | POA: Diagnosis not present

## 2023-07-03 DIAGNOSIS — I447 Left bundle-branch block, unspecified: Secondary | ICD-10-CM

## 2023-07-03 DIAGNOSIS — I10 Essential (primary) hypertension: Secondary | ICD-10-CM | POA: Diagnosis not present

## 2023-07-03 DIAGNOSIS — Z79899 Other long term (current) drug therapy: Secondary | ICD-10-CM | POA: Diagnosis not present

## 2023-07-03 DIAGNOSIS — F419 Anxiety disorder, unspecified: Secondary | ICD-10-CM | POA: Diagnosis present

## 2023-07-03 LAB — CBC
HCT: 41.1 % (ref 36.0–46.0)
Hemoglobin: 13.5 g/dL (ref 12.0–15.0)
MCH: 29.8 pg (ref 26.0–34.0)
MCHC: 32.8 g/dL (ref 30.0–36.0)
MCV: 90.7 fL (ref 80.0–100.0)
Platelets: 283 10*3/uL (ref 150–400)
RBC: 4.53 MIL/uL (ref 3.87–5.11)
RDW: 12.2 % (ref 11.5–15.5)
WBC: 10.2 10*3/uL (ref 4.0–10.5)
nRBC: 0 % (ref 0.0–0.2)

## 2023-07-03 LAB — BASIC METABOLIC PANEL
Anion gap: 10 (ref 5–15)
BUN: 16 mg/dL (ref 6–20)
CO2: 26 mmol/L (ref 22–32)
Calcium: 9 mg/dL (ref 8.9–10.3)
Chloride: 103 mmol/L (ref 98–111)
Creatinine, Ser: 1 mg/dL (ref 0.44–1.00)
GFR, Estimated: >60 mL/min (ref >60)
Glucose, Bld: 83 mg/dL (ref 70–99)
Potassium: 3.9 mmol/L (ref 3.5–5.1)
Sodium: 139 mmol/L (ref 135–145)

## 2023-07-03 LAB — TROPONIN I (HIGH SENSITIVITY)
Troponin I (High Sensitivity): 2 ng/L (ref <18)
Troponin I (High Sensitivity): 3 ng/L (ref <18)
Troponin I (High Sensitivity): 2 ng/L (ref <18)

## 2023-07-03 LAB — D-DIMER, QUANTITATIVE: D-Dimer, Quant: 0.43 ug/mL-FEU (ref 0.00–0.50)

## 2023-07-03 MED ORDER — BUTALBITAL-APAP-CAFFEINE 50-325-40 MG PO TABS: 1.0000 | ORAL_TABLET | ORAL | Status: DC | PRN

## 2023-07-03 MED ORDER — LEVOTHYROXINE SODIUM 50 MCG PO TABS
75.0000 ug | ORAL_TABLET | Freq: Every day | ORAL | Status: DC
  Administered 2023-07-04: 75 ug via ORAL
  Filled 2023-07-03: qty 1

## 2023-07-03 MED ORDER — ACETAMINOPHEN 325 MG PO TABS: 650.0000 mg | ORAL_TABLET | ORAL | Status: DC | PRN

## 2023-07-03 MED ORDER — ASPIRIN 81 MG PO CHEW
324.0000 mg | CHEWABLE_TABLET | Freq: Once | ORAL | Status: AC
  Administered 2023-07-03: 324 mg via ORAL

## 2023-07-03 MED ORDER — VITAMIN C 500 MG PO TABS
500.0000 mg | ORAL_TABLET | Freq: Every day | ORAL | Status: DC
  Administered 2023-07-04: 500 mg via ORAL
  Filled 2023-07-03: qty 1

## 2023-07-03 MED ORDER — NITROGLYCERIN 0.4 MG SL SUBL: 0.4000 mg | SUBLINGUAL_TABLET | SUBLINGUAL | Status: DC | PRN

## 2023-07-03 MED ORDER — PANTOPRAZOLE SODIUM 40 MG PO TBEC: 40.0000 mg | DELAYED_RELEASE_TABLET | Freq: Every day | ORAL | Status: DC

## 2023-07-03 MED ORDER — ESCITALOPRAM OXALATE 10 MG PO TABS
20.0000 mg | ORAL_TABLET | Freq: Every day | ORAL | Status: DC
  Administered 2023-07-04: 20 mg via ORAL
  Filled 2023-07-03: qty 2

## 2023-07-03 MED ORDER — ASPIRIN 81 MG PO TBEC
81.0000 mg | DELAYED_RELEASE_TABLET | Freq: Every day | ORAL | Status: DC
  Administered 2023-07-04: 81 mg via ORAL
  Filled 2023-07-03: qty 1

## 2023-07-03 MED ORDER — PANTOPRAZOLE SODIUM 40 MG PO TBEC
40.0000 mg | DELAYED_RELEASE_TABLET | Freq: Every day | ORAL | Status: DC
  Administered 2023-07-04: 40 mg via ORAL
  Filled 2023-07-03: qty 1

## 2023-07-03 MED ORDER — ENOXAPARIN SODIUM 60 MG/0.6ML IJ SOSY
45.0000 mg | PREFILLED_SYRINGE | INTRAMUSCULAR | Status: DC
  Administered 2023-07-04: 45 mg via SUBCUTANEOUS
  Filled 2023-07-03: qty 0.6

## 2023-07-03 MED ORDER — LORATADINE 10 MG PO TABS
10.0000 mg | ORAL_TABLET | Freq: Every day | ORAL | Status: DC
  Filled 2023-07-03: qty 1

## 2023-07-03 MED ORDER — ALPRAZOLAM 0.25 MG PO TABS: 0.2500 mg | ORAL_TABLET | Freq: Two times a day (BID) | ORAL | Status: DC | PRN

## 2023-07-03 MED ORDER — NITROGLYCERIN 0.4 MG SL SUBL
0.4000 mg | SUBLINGUAL_TABLET | Freq: Once | SUBLINGUAL | Status: AC
  Administered 2023-07-03: 0.4 mg via SUBLINGUAL
  Filled 2023-07-03: qty 1

## 2023-07-03 MED ORDER — MORPHINE SULFATE (PF) 2 MG/ML IV SOLN: 2.0000 mg | INTRAVENOUS | Status: DC | PRN

## 2023-07-03 MED ORDER — ONDANSETRON HCL 4 MG/2ML IJ SOLN: 4.0000 mg | Freq: Four times a day (QID) | INTRAMUSCULAR | Status: DC | PRN

## 2023-07-03 MED ORDER — MAGNESIUM HYDROXIDE 400 MG/5ML PO SUSP: 30.0000 mL | Freq: Every day | ORAL | Status: DC | PRN

## 2023-07-03 MED ORDER — AMLODIPINE BESYLATE 5 MG PO TABS
5.0000 mg | ORAL_TABLET | Freq: Every day | ORAL | Status: DC
  Administered 2023-07-04 (×2): 5 mg via ORAL
  Filled 2023-07-03 (×2): qty 1

## 2023-07-03 MED ORDER — TRAZODONE HCL 50 MG PO TABS: 25.0000 mg | ORAL_TABLET | Freq: Every evening | ORAL | Status: DC | PRN

## 2023-07-03 MED ORDER — SODIUM CHLORIDE 0.9 % IV SOLN: INTRAVENOUS | Status: DC

## 2023-07-03 NOTE — ED Triage Notes (Addendum)
Pt presents to UC c/o chest pain that radiates into back onset today. Pt states she was getting up to get ready for work when pain started. Pt states she did a cardiomobile and it revealed possible afib.  Pt states she presented to [facility] and was told to go to the ED but patient did not due to the amount of people that were there.   Pt states she had done previously a stress test in  2022 and had a minor infarction that showed.

## 2023-07-03 NOTE — Assessment & Plan Note (Signed)
-   We will continue her antihypertensives. 

## 2023-07-03 NOTE — ED Notes (Signed)
Patient to xray.

## 2023-07-03 NOTE — ED Provider Notes (Signed)
[facility]    CSN: [id number] Arrival date & time: 07/03/23  1348      History   Chief Complaint Chief Complaint  Patient presents with   Chest Pain    HPI Suzanne Cox is a 57 y.o. female.   HPI  Asked by nursing staff to see pt in triage as she is having active chest pain History provided by patient and her daughter    Suzanne Cox here for chest pain since around 7 AM. She got up this morning feeling funny and felt pressure in her central chest that radiates to her back near her left scapula.  She did the cardiomobile reading at 735 AM and it was inconclusive. She didn't feel well therefore she did it several other times again and was told she had atrial fibrillation, NSR and inconclusive. Daughter reports her HR dropped to around 30 at one point.  The wait at the ED was long so they came here instead. Denies vomiting, nausea, shortness of breath, jaw pain.  Had headache and feels tired.    Past Medical History:  Diagnosis Date   Acid reflux    Anxiety    Hypertension    Thyroid disease     Patient Active Problem List   Diagnosis Date Noted   Left bundle branch block 07/04/2023   Chest pain 07/03/2023   Essential hypertension 07/03/2023   Hypothyroidism 07/03/2023   GERD without esophagitis 07/03/2023   Migraine 07/03/2023   Pneumonia due to COVID-19 virus 12/29/2020   Acid reflux    Hypertension    Anxiety    Thyroid disease    Acute hypoxemic respiratory failure due to COVID-19 [facility])     Past Surgical History:  Procedure Laterality Date   ABDOMINAL HYSTERECTOMY     CESAREAN SECTION     TONSILLECTOMY      OB History   No obstetric history on file.      Home Medications    Prior to Admission medications   Medication Sig Start Date End Date Taking? Authorizing Provider  amLODipine (NORVASC) 5 MG tablet Take 5 mg by mouth daily. 12/06/20  Yes [provider]  butalbital-acetaminophen-caffeine (FIORICET, ESGIC) 50-325-40 MG  tablet Take 1 tablet by mouth every 4 (four) hours as needed for headache.   Yes [provider]  levothyroxine (SYNTHROID) 75 MCG tablet Take 75 mcg by mouth daily. 12/22/20  Yes [provider]  pantoprazole (PROTONIX) 40 MG tablet Take 40 mg by mouth daily.   Yes [provider]  ascorbic acid (VITAMIN C) 500 MG tablet Take 1 tablet (500 mg total) by mouth daily. 01/03/21   Delfino Lovett, MD  aspirin EC 81 MG tablet Take 1 tablet (81 mg total) by mouth daily. Swallow whole. 07/05/23   Arnetha Courser, MD  cetirizine (ZYRTEC) 10 MG tablet Take 10 mg by mouth daily at 12 noon.    [provider]  escitalopram (LEXAPRO) 20 MG tablet Take 20 mg by mouth daily.    [provider]  metoprolol succinate (TOPROL-XL) 25 MG 24 hr tablet Take 0.5 tablets (12.5 mg total) by mouth daily. 07/05/23   Arnetha Courser, MD  nitroGLYCERIN (NITROSTAT) 0.4 MG SL tablet Place 1 tablet (0.4 mg total) under the tongue every 5 (five) minutes as needed for chest pain. 07/04/23   Arnetha Courser, MD  rosuvastatin (CRESTOR) 10 MG tablet Take 1 tablet (10 mg total) by mouth daily. 07/04/23   Arnetha Courser, MD    Family History  No family history on file.  Social History Social History   Tobacco Use   Smoking status: Never   Smokeless tobacco: Never  Substance Use Topics   Alcohol use: No   Drug use: No     Allergies   Sulfa antibiotics, Theophyllines, Topamax [topiramate], and Tetracyclines & related   Review of Systems Review of Systems :negative unless otherwise stated in HPI.      Physical Exam Triage Vital Signs ED Triage Vitals  Encounter Vitals Group     BP      Systolic BP Percentile      Diastolic BP Percentile      Pulse      Resp      Temp      Temp src      SpO2      Weight      Height      Head Circumference      Peak Flow      Pain Score      Pain Loc      Pain Education      Exclude from Growth Chart    No data found.  Updated Vital  Signs BP 137/85 (BP Location: Right Arm)   Pulse 90   Temp 98.2 F (36.8 C) (Oral)   SpO2 94%   Visual Acuity Right Eye Distance:   Left Eye Distance:   Bilateral Distance:    Right Eye Near:   Left Eye Near:    Bilateral Near:     Physical Exam  GEN:  well appearing female, in no acute distress  CV: regular rate and rhythm,  no murmurs, rubs or gallops  appreciated RESP: no increased work of breathing, clear to ascultation bilaterally SKIN: warm, moist,  NEURO: alert, oriented, moves all extremities appropriately PSYCH: Normal affect, appropriate speech and behavior   UC Treatments / Results  Labs (all labs ordered are listed, but only abnormal results are displayed) Labs Reviewed - No data to display  EKG  See my EKG interpretation in the MDM section  Radiology    Procedures Procedures (including critical care time)  Medications Ordered in UC Medications  aspirin chewable tablet 324 mg (324 mg Oral Given 07/03/23 1416)    Initial Impression / Assessment and Plan / UC Course  I have reviewed the triage vital signs and the nursing notes.  Pertinent labs & imaging results that were available during my care of the patient were reviewed by me and considered in my medical decision making (see chart for details).       Patient is a 57 y.o. female with history of HTN, prediabetes, GERD, hypothyroidism, anxiety who presents with less than 8 hours of substernal chest pain that radiates to the upper back.   Differential diagnosis includes, but is not limited to, ACS, aortic dissection, pulmonary embolism, cardiac tamponade, pneumothorax, pneumonia, pericarditis/myocarditis, GI-related causes including esophagitis/gastritis, and musculoskeletal chest wall pain   On chart review, pt seen at outside urgent care this morning and advised to go to the ED for chest pain. She showed up here instead as she didn't want to wait in the ED.  EKG obtained showing NSR but new LBBB  compared to previous EKG from Jan 2022. UC workup truncated.  Given her symptoms and new LBBB, recommended EMS travel to ED and pt agreeable. EMS called and updated upon arrival. [facility] ED charge RN to alert her of pt's arrival via EMS.   Discussed MDM, treatment plan and plan  for follow-up with patient who agrees with plan.      Final Clinical Impressions(s) / UC Diagnoses   Final diagnoses:  Left bundle branch block  Chest pain, unspecified type     Discharge Instructions      You have been advised to follow up immediately in the emergency department for concerning signs or symptoms as discussed during your visit.      ED Prescriptions   None    PDMP not reviewed this encounter.   Katha Cabal, DO 07/05/23 1032

## 2023-07-03 NOTE — H&P (Addendum)
[place]   PATIENT NAME: Suzanne Cox    MR#:  [id number]  DATE OF BIRTH:  03-May-1966  DATE OF ADMISSION:  07/03/2023  PRIMARY CARE PHYSICIAN: Rolm Gala, MD   Patient is coming from: Home  REQUESTING/REFERRING PHYSICIAN: Trinna Post, MD  CHIEF COMPLAINT:   Chief Complaint  Patient presents with   Chest Pain    HISTORY OF PRESENT ILLNESS:  Suzanne Cox is a 57 y.o. Caucasian female with medical history significant for GERD, anxiety, hypertension and hypothyroidism, who presented to the emergency room with acute onset of chest pain that started around 7 AM and was felt as a dull aching pain and pressure in left parasternal  area with radiation to her back near her scapula.  She graded it 4/10 in severity.  It has been intermittent since then.  She has a Kardia mobile device that was reading as inconclusive when it normally reads normal sinus rhythm.  She went to urgent care and had EKG showing suspected new left bundle branch block when she was sent to the ER for further assessment.  She received 4 baby aspirin prior to coming to the ER.  Her pain is improved but continued to rate as 3/10 in severity.  She denies any cough or wheezing or hemoptysis.  No leg pain or edema or recent travels or surgeries.  She had nausea with her chest pain without vomiting or abdominal pain.  No dysuria, oliguria or hematuria or flank pain.  No bleeding diathesis.  ED Course: When she came to the ER, BP was 154/83 with otherwise normal vital signs.  Labs revealed unremarkable BMP and CBC.  High sensitive troponin I was 2 and later 3.  D-dimer was 0.43. EKG as reviewed by me :  -Her earliest EKG showed no sinus rhythm with a rate of 77 with left axis deviation and suspected left bundle branch block with T wave inversion laterally. - When she came to the ER, EKG showed normal sinus rhythm with a rate of 67 with borderline left axis deviation and T wave inversion in V1 through V3 with low  voltage QRS.  -2 hours later, repeat EKG showed normal sinus rhythm with a rate of 64 with low voltage QRS and T wave inversion in V1 through V3. Imaging: 2 view chest x-ray showed no acute cardiopulmonary disease.  The patient will be admitted to an observation cardiac telemetry bed for further evaluation and management. PAST MEDICAL HISTORY:   Past Medical History:  Diagnosis Date   Acid reflux    Anxiety    Hypertension    Thyroid disease     PAST SURGICAL HISTORY:   Past Surgical History:  Procedure Laterality Date   ABDOMINAL HYSTERECTOMY     CESAREAN SECTION     TONSILLECTOMY      SOCIAL HISTORY:   Social History   Tobacco Use   Smoking status: Never   Smokeless tobacco: Never  Substance Use Topics   Alcohol use: No    FAMILY HISTORY:  History reviewed. No pertinent family history.  DRUG ALLERGIES:   Allergies  Allergen Reactions   Sulfa Antibiotics Rash and Hives   Theophyllines Other (See Comments)    Headache   Topamax [Topiramate] Itching   Tetracyclines & Related Rash    REVIEW OF SYSTEMS:   ROS As per history of present illness. All pertinent systems were reviewed above. Constitutional, HEENT, cardiovascular, respiratory, GI, GU, musculoskeletal, neuro, psychiatric, endocrine, integumentary and hematologic systems  were reviewed and are otherwise negative/unremarkable except for positive findings mentioned above in the HPI.   MEDICATIONS AT HOME:   Prior to Admission medications   Medication Sig Start Date End Date Taking? Authorizing Provider  amLODipine (NORVASC) 5 MG tablet Take 5 mg by mouth daily. 12/06/20   [provider]  ascorbic acid (VITAMIN C) 500 MG tablet Take 1 tablet (500 mg total) by mouth daily. 01/03/21   Delfino Lovett, MD  butalbital-acetaminophen-caffeine (FIORICET, ESGIC) 6602682803 MG tablet Take 1 tablet by mouth every 4 (four) hours as needed for headache.    [provider]  cetirizine (ZYRTEC) 10 MG  tablet Take 10 mg by mouth daily at 12 noon.    [provider]  escitalopram (LEXAPRO) 20 MG tablet Take 20 mg by mouth daily.    [provider]  levothyroxine (SYNTHROID) 75 MCG tablet Take 75 mcg by mouth daily. 12/22/20   [provider]  pantoprazole (PROTONIX) 40 MG tablet Take 40 mg by mouth daily.    [provider]      VITAL SIGNS:  Blood pressure (!) 161/80, pulse 65, temperature 98.3 F (36.8 C), resp. rate (!) 22, height 5' 1.5" (1.562 m), weight 93 kg, SpO2 100%.  PHYSICAL EXAMINATION:  Physical Exam  GENERAL:  57 y.o.-year-old Caucasian female patient lying in the bed with no acute distress.  EYES: Pupils equal, round, reactive to light and accommodation. No scleral icterus. Extraocular muscles intact.  HEENT: Head atraumatic, normocephalic. Oropharynx and nasopharynx clear.  NECK:  Supple, no jugular venous distention. No thyroid enlargement, no tenderness.  LUNGS: Normal breath sounds bilaterally, no wheezing, rales,rhonchi or crepitation. No use of accessory muscles of respiration.  CARDIOVASCULAR: Regular rate and rhythm, S1, S2 normal. No murmurs, rubs, or gallops.  ABDOMEN: Soft, nondistended, nontender. Bowel sounds present. No organomegaly or mass.  EXTREMITIES: No pedal edema, cyanosis, or clubbing.  NEUROLOGIC: Cranial nerves II through XII are intact. Muscle strength 5/5 in all extremities. Sensation intact. Gait not checked.  PSYCHIATRIC: The patient is alert and oriented x 3.  Normal affect and good eye contact. SKIN: No obvious rash, lesion, or ulcer.   LABORATORY PANEL:   CBC Recent Labs  Lab 07/03/23 1508  WBC 10.2  HGB 13.5  HCT 41.1  PLT 283   ------------------------------------------------------------------------------------------------------------------  Chemistries  Recent Labs  Lab 07/03/23 1508  NA 139  K 3.9  CL 103  CO2 26  GLUCOSE 83  BUN 16  CREATININE 1.00  CALCIUM 9.0    ------------------------------------------------------------------------------------------------------------------  Cardiac Enzymes No results for input(s): "TROPONINI" in the last 168 hours. ------------------------------------------------------------------------------------------------------------------  RADIOLOGY:  DG Chest 2 View  Result Date: 07/03/2023 CLINICAL DATA:  Chest pain. EXAM: CHEST - 2 VIEW COMPARISON:  December 28, 2020. FINDINGS: The heart size and mediastinal contours are within normal limits. Both lungs are clear. The visualized skeletal structures are unremarkable. IMPRESSION: No active cardiopulmonary disease. Electronically Signed   By: Lupita Raider M.D.   On: 07/03/2023 15:55      IMPRESSION AND PLAN:  Assessment and Plan: * Chest pain - The patient will be admitted to an observation cardiac telemetry bed. - Will follow serial troponins and EKGs. - The patient will be placed on aspirin as well as p.r.n. sublingual nitroglycerin and morphine sulfate for pain. - We will obtain a cardiology consult in a.m. for further cardiac risk stratification. - I notified Dr. Juliann Pares about the patient   Essential hypertension - We will continue her antihypertensives.  Hypothyroidism - We will continue Synthroid.  GERD without esophagitis - We will continue PPI therapy.  Anxiety - We will continue Lexapro.  Migraine - We will continue her as needed Fioricet.   DVT prophylaxis: Lovenox.  Advanced Care Planning:  Code Status: full code.  Family Communication:  The plan of care was discussed in details with the patient (and family). I answered all questions. The patient agreed to proceed with the above mentioned plan. Further management will depend upon hospital course. Disposition Plan: Back to previous home environment Consults called: Cardiology. All the records are reviewed and case discussed with ED provider.  Status is: Observation  I certify that at  the time of admission, it is my clinical judgment that the patient will require hospital care extending less than 2 midnights.                            Dispo: The patient is from: Home              Anticipated d/c is to: Home              Patient currently is not medically stable to d/c.              Difficult to place patient: No  Hannah Beat M.D on 07/03/2023 at 8:51 PM  [facility]   From 7 PM-7 AM, contact night-coverage [email or web address]  CC: Primary care physician; Rolm Gala, MD

## 2023-07-03 NOTE — ED Provider Notes (Signed)
[facility] Provider Note    Event Date/Time   First MD Initiated Contact with Patient 07/03/23 1507     (approximate)   History   Chest Pain   HPI  Suzanne Cox is a 57 y.o. female with history of hypertension, hyperlipidemia, shortness of breath presenting to the emergency department for evaluation of chest pain.  Around 7 AM this morning, patient noted onset of chest pain described as a pressure in the center of her chest radiating to her back near her scapula.  It has been waxing and waning since onset.  She has a Kardia mobile device that she uses in her reading was inconclusive when it is normally normal sinus rhythm.  She presented to urgent care.  There, she had an EKG demonstrating a new left bundle branch block.  In the setting of this, she was sent to the ER for further evaluation.  She did receive 324 of aspirin.  At the time of my evaluation, patient reports some improvement in her chest pain, continues to rated as a 3 out of 10.  Denies history of similar pain.      Physical Exam   Triage Vital Signs: ED Triage Vitals  Encounter Vitals Group     BP 07/03/23 1455 (!) 154/83     Systolic BP Percentile --      Diastolic BP Percentile --      Pulse Rate 07/03/23 1455 80     Resp 07/03/23 1455 17     Temp 07/03/23 1455 98.3 F (36.8 C)     Temp src --      SpO2 07/03/23 1449 99 %     Weight 07/03/23 1455 205 lb (93 kg)     Height 07/03/23 1455 5' 1.5" (1.562 m)     Head Circumference --      Peak Flow --      Pain Score 07/03/23 1454 4     Pain Loc --      Pain Education --      Exclude from Growth Chart --     Most recent vital signs: Vitals:   07/03/23 2039 07/03/23 2248  BP:  (!) 144/88  Pulse: 65 69  Resp: (!) 22 18  Temp:  97.8 F (36.6 C)  SpO2: 100% 98%     General: Awake, interactive  CV:  Regular rate, good peripheral perfusion.  Resp:  Lungs clear, unlabored respirations.  Abd:  Soft, nondistended.   Neuro:  Symmetric facial movement, fluid speech   ED Results / Procedures / Treatments   Labs (all labs ordered are listed, but only abnormal results are displayed) Labs Reviewed  BASIC METABOLIC PANEL  CBC  D-DIMER, QUANTITATIVE  HIV ANTIBODY (ROUTINE TESTING W REFLEX)  LIPID PANEL  TROPONIN I (HIGH SENSITIVITY)  TROPONIN I (HIGH SENSITIVITY)  TROPONIN I (HIGH SENSITIVITY)  TROPONIN I (HIGH SENSITIVITY)     EKG EKG independently reviewed interpreted by myself (ER attending) demonstrates:  Multiple EKGs reviewed EKG from 1357 at urgent care reviewed demonstrates normal sinus rhythm at a rate of 77, PR 142, QRS 130, QTc 431, left bundle branch block morphology noted without positive modified Sgarbossa criteria  EKG from 1453 reviewed.  This demonstrates what appears to be a wide-complex rhythm for a few beats after which patient transitions to a narrow complex rhythm rate 67, PR 132, QRS 88, QTc 422  Another EKG was obtained at 1648.  This demonstrates narrow complex sinus rhythm at a  rate of 64, PR 134, QRS 88, QTc 427, no evidence of left bundle branch block  RADIOLOGY Imaging independently reviewed and interpreted by myself demonstrates:  CXR without acute abnormality  PROCEDURES:  Critical Care performed: No  Procedures   MEDICATIONS ORDERED IN ED: Medications  butalbital-acetaminophen-caffeine (FIORICET) 50-325-40 MG per tablet 1 tablet (has no administration in time range)  amLODipine (NORVASC) tablet 5 mg (5 mg Oral Given 07/04/23 0001)  escitalopram (LEXAPRO) tablet 20 mg (has no administration in time range)  levothyroxine (SYNTHROID) tablet 75 mcg (has no administration in time range)  ascorbic acid (VITAMIN C) tablet 500 mg (has no administration in time range)  loratadine (CLARITIN) tablet 10 mg (has no administration in time range)  acetaminophen (TYLENOL) tablet 650 mg (has no administration in time range)  ondansetron (ZOFRAN) injection 4 mg (has no  administration in time range)  enoxaparin (LOVENOX) injection 45 mg (45 mg Subcutaneous Given 07/04/23 0002)  0.9 %  sodium chloride infusion ( Intravenous New Bag/Given 07/03/23 2311)  aspirin EC tablet 81 mg (0 mg Oral Duplicate 07/04/23 0002)  ALPRAZolam (XANAX) tablet 0.25 mg (has no administration in time range)  traZODone (DESYREL) tablet 25 mg (has no administration in time range)  magnesium hydroxide (MILK OF MAGNESIA) suspension 30 mL (has no administration in time range)  morphine (PF) 2 MG/ML injection 2 mg (has no administration in time range)  nitroGLYCERIN (NITROSTAT) SL tablet 0.4 mg (has no administration in time range)  pantoprazole (PROTONIX) EC tablet 40 mg (40 mg Oral Given 07/04/23 0001)  nitroGLYCERIN (NITROSTAT) SL tablet 0.4 mg (0.4 mg Sublingual Given 07/03/23 2044)     IMPRESSION / MDM / ASSESSMENT AND PLAN / ED COURSE  I reviewed the triage vital signs and the nursing notes.  Differential diagnosis includes, but is not limited to, ACS, PE, pneumothorax, pneumonia, musculoskeletal strain  Patient's presentation is most consistent with acute presentation with potential threat to life or bodily function.  57 year old female presenting with chest pain with new left bundle branch block noted on EKG at urgent care, though interestingly demonstrates narrow complex sinus rhythm here without evidence of bundle branch block.  I did review the EKG from urgent care.  I do not note any positive Sgarbossa criteria.  Her lab work here is reassuring with normal CBC, BMP, negative initial and repeat troponin.  D-dimer negative.  She does have some ongoing pain here.  In the setting of this, will discuss with cardiology for further recommendations.  Clinical Course as of 07/04/23 0007  Wed Jul 03, 2023  2004 Case was discussed with Dr. Juliann Pares with cardiology.  He did report that with ongoing chest pain, admission was reasonable for stress test.  However, if patient prefer discharge, he  could arrange outpatient follow-up in the clinic for her tomorrow.  Discussed these options with the patient, she does prefer to stay and be admitted.  Will reach out to hospitalist team. [NR]    Clinical Course User Index [NR] Trinna Post, MD     FINAL CLINICAL IMPRESSION(S) / ED DIAGNOSES   Final diagnoses:  Acute chest pain  Left bundle branch block     Rx / DC Orders   ED Discharge Orders     None        Note:  This document was prepared using Dragon voice recognition software and may include unintentional dictation errors.   Trinna Post, MD 07/04/23 [phone]

## 2023-07-03 NOTE — Assessment & Plan Note (Signed)
-   We will continue Lexapro. 

## 2023-07-03 NOTE — ED Triage Notes (Addendum)
GCS15 Pt here for midsternal dull CP that radiates to back. Pain started this am. Lungs CTA abd soft NTND pos BS. Pt taken to [facility] via EMS

## 2023-07-03 NOTE — ED Triage Notes (Signed)
Pt arrives via EMS from [facility]. Pt reports central chest pain that radiates to her back since 0700 this morning. UC had concerns for new LBB. Pt received 324mg  of ASA at UC. Pt reports some associated nausea. Pt AxOx4

## 2023-07-03 NOTE — Discharge Instructions (Signed)
You have been advised to follow up immediately in the emergency department for concerning signs or symptoms as discussed during your visit.    

## 2023-07-03 NOTE — ED Notes (Signed)
Patient is being discharged from the Urgent Care and sent to the Emergency Department via EMS . Per Katha Cabal, DO, patient is in need of higher level of care due to chest pain, new LT bundle branch block. Patient is aware and verbalizes understanding of plan of care.  Vitals:   07/03/23 1357  BP: 137/85  Pulse: 90  Temp: 98.2 F (36.8 C)  SpO2: 94%

## 2023-07-03 NOTE — Assessment & Plan Note (Signed)
-   We will continue Synthroid. 

## 2023-07-03 NOTE — ED Notes (Signed)
ED Provider at bedside. 

## 2023-07-03 NOTE — Assessment & Plan Note (Signed)
-   We will continue PPI therapy 

## 2023-07-03 NOTE — Assessment & Plan Note (Signed)
-   We will continue her as needed Fioricet. 

## 2023-07-03 NOTE — ED Notes (Signed)
EMS called for patient.

## 2023-07-03 NOTE — Assessment & Plan Note (Signed)
Cardiology was consulted due to EKG changes.  Troponin remain negative.  Stress testing was negative for any significant ischemia.  Lipid panel with mildly elevated triglyceride and LDL of 100 with goal less than 70. She was started on aspirin and Crestor.

## 2023-07-04 ENCOUNTER — Observation Stay
Admit: 2023-07-04 | Discharge: 2023-07-04 | Disposition: A | Discharge status: Home / Self Care | Payer: BC Managed Care – PPO | Attending: Internal Medicine | Admitting: Internal Medicine

## 2023-07-04 DIAGNOSIS — E039 Hypothyroidism, unspecified: Secondary | ICD-10-CM | POA: Diagnosis not present

## 2023-07-04 DIAGNOSIS — I10 Essential (primary) hypertension: Secondary | ICD-10-CM | POA: Diagnosis not present

## 2023-07-04 DIAGNOSIS — I447 Left bundle-branch block, unspecified: Secondary | ICD-10-CM

## 2023-07-04 DIAGNOSIS — R079 Chest pain, unspecified: Secondary | ICD-10-CM | POA: Diagnosis not present

## 2023-07-04 DIAGNOSIS — K219 Gastro-esophageal reflux disease without esophagitis: Secondary | ICD-10-CM

## 2023-07-04 DIAGNOSIS — F419 Anxiety disorder, unspecified: Secondary | ICD-10-CM

## 2023-07-04 LAB — LIPID PANEL
Cholesterol: 188 mg/dL (ref 0–200)
HDL: 46 mg/dL (ref >40)
LDL Cholesterol: 100 mg/dL — ABNORMAL HIGH (ref 0–99)
Total CHOL/HDL Ratio: 4.1 RATIO
Triglycerides: 212 mg/dL — ABNORMAL HIGH (ref <150)
VLDL: 42 mg/dL — ABNORMAL HIGH (ref 0–40)

## 2023-07-04 LAB — NM MYOCAR MULTI W/SPECT W/WALL MOTION / EF
LV dias vol: 50 mL (ref 46–106)
LV sys vol: 19 mL
Nuc Stress EF: 62 %
Rest Nuclear Isotope Dose: 10.7 mCi
SDS: 0
SRS: 9
SSS: 3
Stress Nuclear Isotope Dose: 31.3 mCi

## 2023-07-04 LAB — TROPONIN I (HIGH SENSITIVITY): Troponin I (High Sensitivity): <2 ng/L (ref <18)

## 2023-07-04 MED ORDER — ROSUVASTATIN CALCIUM 10 MG PO TABS: 10.0000 mg | ORAL_TABLET | Freq: Every day | ORAL | 1 refills | Status: AC

## 2023-07-04 MED ORDER — ASPIRIN 81 MG PO TBEC: 81.0000 mg | DELAYED_RELEASE_TABLET | Freq: Every day | ORAL | 12 refills | Status: AC

## 2023-07-04 MED ORDER — TECHNETIUM TC 99M TETROFOSMIN IV KIT
31.3300 | PACK | Freq: Once | INTRAVENOUS | Status: AC | PRN
  Administered 2023-07-04: 31.33 via INTRAVENOUS

## 2023-07-04 MED ORDER — TECHNETIUM TC 99M TETROFOSMIN IV KIT
10.0000 | PACK | Freq: Once | INTRAVENOUS | Status: AC | PRN
  Administered 2023-07-04: 10.65 via INTRAVENOUS

## 2023-07-04 MED ORDER — ROSUVASTATIN CALCIUM 10 MG PO TABS: 10.0000 mg | ORAL_TABLET | Freq: Every day | ORAL | Status: DC

## 2023-07-04 MED ORDER — REGADENOSON 0.4 MG/5ML IV SOLN
0.4000 mg | Freq: Once | INTRAVENOUS | Status: AC
  Administered 2023-07-04: 0.4 mg via INTRAVENOUS

## 2023-07-04 MED ORDER — METOPROLOL SUCCINATE ER 25 MG PO TB24
12.5000 mg | ORAL_TABLET | Freq: Every day | ORAL | Status: DC
  Administered 2023-07-04: 12.5 mg via ORAL
  Filled 2023-07-04: qty 1

## 2023-07-04 MED ORDER — METOPROLOL SUCCINATE ER 25 MG PO TB24: 12.5000 mg | ORAL_TABLET | Freq: Every day | ORAL | 1 refills | Status: AC

## 2023-07-04 MED ORDER — NITROGLYCERIN 0.4 MG SL SUBL: 0.4000 mg | SUBLINGUAL_TABLET | SUBLINGUAL | 12 refills | Status: AC | PRN

## 2023-07-04 NOTE — Hospital Course (Addendum)
Taken from H&P.  Suzanne Cox is a 57 y.o. Caucasian female with medical history significant for GERD, anxiety, hypertension and hypothyroidism, who presented to the emergency room with acute onset of chest pain .  She initially presented to urgent care and had EKG showing new left bundle branch block so she was sent to ED for further evaluation.  ED course: Vitals with mildly elevated blood pressure at 154/83, labs unremarkable.  Troponin negative.  EKG with normal sinus rhythm, left axis deviation and suspected left bundle branch block with T wave inversion in V1 through V3. Chest x-ray was without any acute abnormality.  Cardiology was consulted.  7/25: Vital stable, troponin remain negative.  Lipid panel with mildly elevated triglyceride at 212, HDL 46 and LDL 100.  Myoview today without any sign of ischemia.  She is being started on baby aspirin, low-dose metoprolol and  Crestor which can be titrated as tolerated by her PCP.  No more chest pain or shortness of breath.  Patient will continue on current medications and need to have a close follow-up with her providers for further recommendations.

## 2023-07-04 NOTE — Discharge Summary (Signed)
Physician Discharge Summary   Patient: Suzanne Cox MRN: [id number] DOB: 19-Nov-1966  Admit date:     07/03/2023  Discharge date: 07/04/23  Discharge Physician: Arnetha Courser   PCP: Rolm Gala, MD   Recommendations at discharge:  Please obtain CBC and CMP on follow-up Patient is being started on baby aspirin, low-dose metoprolol and Crestor-titrate the dose as appropriate Follow-up with cardiology Follow-up with primary care provider  Discharge Diagnoses: Principal Problem:   Chest pain Active Problems:   Essential hypertension   Hypothyroidism   GERD without esophagitis   Anxiety   Migraine   Left bundle branch block   Hospital Course: Taken from H&P.  Suzanne Cox is a 57 y.o. Caucasian female with medical history significant for GERD, anxiety, hypertension and hypothyroidism, who presented to the emergency room with acute onset of chest pain .  She initially presented to urgent care and had EKG showing new left bundle branch block so she was sent to ED for further evaluation.  ED course: Vitals with mildly elevated blood pressure at 154/83, labs unremarkable.  Troponin negative.  EKG with normal sinus rhythm, left axis deviation and suspected left bundle branch block with T wave inversion in V1 through V3. Chest x-ray was without any acute abnormality.  Cardiology was consulted.  7/25: Vital stable, troponin remain negative.  Lipid panel with mildly elevated triglyceride at 212, HDL 46 and LDL 100.  Myoview today without any sign of ischemia.  She is being started on baby aspirin, low-dose metoprolol and  Crestor which can be titrated as tolerated by her PCP.  No more chest pain or shortness of breath.  Patient will continue on current medications and need to have a close follow-up with her providers for further recommendations.     Assessment and Plan: * Chest pain Cardiology was consulted due to EKG changes.  Troponin remain negative.  Stress testing was  negative for any significant ischemia.  Lipid panel with mildly elevated triglyceride and LDL of 100 with goal less than 70. She was started on aspirin and Crestor.  Essential hypertension - We will continue her antihypertensives.  Hypothyroidism - We will continue Synthroid.  GERD without esophagitis - We will continue PPI therapy.  Anxiety - We will continue Lexapro.  Migraine - We will continue her as needed Fioricet.   Consultants: Cardiology Procedures performed: Myoview Disposition: Home Diet recommendation:  Discharge Diet Orders (From admission, onward)     Start     Ordered   07/04/23 0000  Diet - low sodium heart healthy        07/04/23 1626           Cardiac diet DISCHARGE MEDICATION: Allergies as of 07/04/2023       Reactions   Sulfa Antibiotics Rash, Hives   Theophyllines Other (See Comments)   Headache   Topamax [topiramate] Itching   Tetracyclines & Related Rash        Medication List     TAKE these medications    amLODipine 5 MG tablet Commonly known as: NORVASC Take 5 mg by mouth daily.   ascorbic acid 500 MG tablet Commonly known as: VITAMIN C Take 1 tablet (500 mg total) by mouth daily.   aspirin EC 81 MG tablet Take 1 tablet (81 mg total) by mouth daily. Swallow whole. Start taking on: July 05, 2023   butalbital-acetaminophen-caffeine 50-325-40 MG tablet Commonly known as: FIORICET Take 1 tablet by mouth every 4 (four) hours as needed for headache.  cetirizine 10 MG tablet Commonly known as: ZYRTEC Take 10 mg by mouth daily at 12 noon.   escitalopram 20 MG tablet Commonly known as: LEXAPRO Take 20 mg by mouth daily.   levothyroxine 75 MCG tablet Commonly known as: SYNTHROID Take 75 mcg by mouth daily.   metoprolol succinate 25 MG 24 hr tablet Commonly known as: TOPROL-XL Take 0.5 tablets (12.5 mg total) by mouth daily. Start taking on: July 05, 2023   nitroGLYCERIN 0.4 MG SL tablet Commonly known as:  NITROSTAT Place 1 tablet (0.4 mg total) under the tongue every 5 (five) minutes as needed for chest pain.   pantoprazole 40 MG tablet Commonly known as: PROTONIX Take 40 mg by mouth daily.   rosuvastatin 10 MG tablet Commonly known as: CRESTOR Take 1 tablet (10 mg total) by mouth daily.        Follow-up Information     Paraschos, Alexander, MD. Go in 1 week(s).   Specialty: Cardiology Contact information: [address] [facility] [place] [address] [phone]         Rolm Gala, MD. Schedule an appointment as soon as possible for a visit in 1 week(s).   Specialty: Family Medicine Contact information: [address] [place] [address] [phone]                Discharge Exam: Ceasar Mons Weights   07/03/23 1455  Weight: 93 kg   General.  Obese lady, in no acute distress. Pulmonary.  Lungs clear bilaterally, normal respiratory effort. CV.  Regular rate and rhythm, no JVD, rub or murmur. Abdomen.  Soft, nontender, nondistended, BS positive. CNS.  Alert and oriented .  No focal neurologic deficit. Extremities.  No edema, no cyanosis, pulses intact and symmetrical. Psychiatry.  Judgment and insight appears normal.   Condition at discharge: stable  The results of significant diagnostics from this hospitalization (including imaging, microbiology, ancillary and laboratory) are listed below for reference.   Imaging Studies: NM Myocar Multi W/Spect W/Wall Motion / EF  Result Date: 07/04/2023   The study is normal. The study is low risk.   No ST deviation was noted.   LV perfusion is normal. There is no evidence of ischemia. There is no evidence of infarction.   Left ventricular function is normal. Nuclear stress EF: 62%. The left ventricular ejection fraction is normal (55-65%). End diastolic cavity size is normal. End systolic cavity size is normal. 1.  Normal left ventricular function 2.  No definitive evidence for scar or  ischemia 3.  Low risk study   DG Chest 2 View  Result Date: 07/03/2023 CLINICAL DATA:  Chest pain. EXAM: CHEST - 2 VIEW COMPARISON:  December 28, 2020. FINDINGS: The heart size and mediastinal contours are within normal limits. Both lungs are clear. The visualized skeletal structures are unremarkable. IMPRESSION: No active cardiopulmonary disease. Electronically Signed   By: Lupita Raider M.D.   On: 07/03/2023 15:55    Microbiology: No results found for this or any previous visit.  Labs: CBC: Recent Labs  Lab 07/03/23 1508  WBC 10.2  HGB 13.5  HCT 41.1  MCV 90.7  PLT 283   Basic Metabolic Panel: Recent Labs  Lab 07/03/23 1508  NA 139  K 3.9  CL 103  CO2 26  GLUCOSE 83  BUN 16  CREATININE 1.00  CALCIUM 9.0   Liver Function Tests: No results for input(s): "AST", "ALT", "ALKPHOS", "BILITOT", "PROT", "ALBUMIN" in the last 168 hours. CBG: No results  for input(s): "GLUCAP" in the last 168 hours.  Discharge time spent: greater than 30 minutes.  This record has been created using Conservation officer, historic buildings. Errors have been sought and corrected,but may not always be located. Such creation errors do not reflect on the standard of care.   Signed: Arnetha Courser, MD [facility] 07/04/2023

## 2023-07-04 NOTE — Consult Note (Signed)
[facility] CLINIC CARDIOLOGY CONSULT NOTE       Patient ID: Suzanne Cox MRN: [id number] DOB/AGE: July 11, 1966 57 y.o.  Admit date: 07/03/2023 Referring Physician Dr. Trinna Post Primary Physician Dr. Rolm Gala  Primary Cardiologist prev Dr. Gwen Pounds  Reason for Consultation chest pain   HPI: Suzanne Cox is a 57yoF with a PMH of HTN, hypothyroidism, OSA (noncompliant with CPAP), normal stress echo (2022) who presented to [facility] ED 07/03/2023 from local urgent care with an chest pain and new intermittent LBBB detected by her Kardia mobile device and confirmed by twelve-lead EKG.  Cardiology is consulted for further assistance.  It is with her daughter who contributes to the history.  She says that yesterday morning she started "feeling funny" and tired and noted central chest pain that she describes as a pressure while she was at work.  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  She also noted she had a headache and felt she was nauseous as a result.  She said her chest pressure "did not last long" but has intermittently radiated to her back, is to go away on its own/when she sits down to rest.  She uses her Lourena Simmonds mobile device frequently and yesterday it noted a "incomplete LBBB" which was concerning for her, prompting her presentation.  She denies shortness of breath, exertional dyspnea, orthopnea or PND.  No lightheadedness dizziness, or lower extremity edema.  She has a history of "heart fluttering" that has happened historically, intermittently which is different in character than what she was experiencing yesterday.  In the department she was given 1 sublingual nitroglycerin and loaded with 4 baby aspirin.  A nuclear stress test was ordered and planned for this morning.  In my time of evaluation this morning she is standing at the sink washing her face and overall feels well.  She notes she feels overall okay although tired but denies  active chest pain or pressure comparable to what was occurring yesterday.    Labs are notable for negative troponins on multiple repeats at 2, 3, 2, and <2, TC 188, HDL 46, LDL 100, triglycerides 212, VLDL 42.  Non smoker, drinks alcohol once or twice monthly socially.  No drug use.  History of a normal stress echocardiogram.  Patient's father with a history of atrial fibrillation, and daughter has PVCs.  Review of systems complete and found to be negative unless listed above     Past Medical History:  Diagnosis Date   Acid reflux    Anxiety    Hypertension    Thyroid disease     Past Surgical History:  Procedure Laterality Date   ABDOMINAL HYSTERECTOMY     CESAREAN SECTION     TONSILLECTOMY      Medications Prior to Admission  Medication Sig Dispense Refill Last Dose   amLODipine (NORVASC) 5 MG tablet Take 5 mg by mouth daily.      ascorbic acid (VITAMIN C) 500 MG tablet Take 1 tablet (500 mg total) by mouth daily. 30 tablet 0    butalbital-acetaminophen-caffeine (FIORICET, ESGIC) 50-325-40 MG tablet Take 1 tablet by mouth every 4 (four) hours as needed for headache.  0    cetirizine (ZYRTEC) 10 MG tablet Take 10 mg by mouth daily at 12 noon.      escitalopram (LEXAPRO) 20 MG tablet Take 20 mg by mouth daily.  0    levothyroxine (SYNTHROID) 75 MCG tablet Take 75 mcg by  mouth daily.      pantoprazole (PROTONIX) 40 MG tablet Take 40 mg by mouth daily.      Social History   Socioeconomic History   Marital status: Divorced    Spouse name: Not on file   Number of children: Not on file   Years of education: Not on file   Highest education level: Not on file  Occupational History   Not on file  Tobacco Use   Smoking status: Never   Smokeless tobacco: Never  Substance and Sexual Activity   Alcohol use: No   Drug use: No   Sexual activity: Not on file  Other Topics Concern   Not on file  Social History Narrative   Not on file   Social Determinants of Health    Financial Resource Strain: Low Risk  (12/12/2022)   Received from [facility]   Overall Financial Resource Strain (CARDIA)    Difficulty of Paying Living Expenses: Not hard at all  Food Insecurity: No Food Insecurity (07/03/2023)   Hunger Vital Sign    Worried About Running Out of Food in the Last Year: Never true    Ran Out of Food in the Last Year: Never true  Transportation Needs: No Transportation Needs (07/03/2023)   PRAPARE - Administrator, Civil Service (Medical): No    Lack of Transportation (Non-Medical): No  Physical Activity: Unknown (11/22/2020)   Received from [facility]   Exercise Vital Sign    Days of Exercise per Week: 7 days    Minutes of Exercise per Session: Not on file  Stress: Not on file  Social Connections: Moderately Isolated (11/22/2020)   Received from [facility]   Social Connection and Isolation Panel [NHANES]    Frequency of Communication with Friends and Family: More than three times a week    Frequency of Social Gatherings with Friends and Family: Once a week    Attends Religious Services: More than 4 times per year    Active Member of [employer] or Organizations: No    Attends Banker Meetings: Never    Marital Status: Divorced  Catering manager Violence: Not At Risk (07/03/2023)   Humiliation, Afraid, Rape, and Kick questionnaire    Fear of Current or Ex-Partner: No    Emotionally Abused: No    Physically Abused: No    Sexually Abused: No    History reviewed. No pertinent family history.    Intake/Output Summary (Last 24 hours) at 07/04/2023 0826 Last data filed at 07/03/2023 2300 Gross per 24 hour  Intake 240 ml  Output --  Net 240 ml    Vitals:   07/03/23 2248 07/04/23 0341 07/04/23 0808 07/04/23 0814  BP: (!) 144/88 113/78 138/86 138/86  Pulse: 69 70 67 66  Resp: 18 15 16 16   Temp: 97.8 F (36.6 C) 97.7 F (36.5 C) 97.9 F (36.6 C) 97.9 F (36.6 C)   TempSrc: Oral Oral    SpO2: 98% 96% 97% 100%  Weight:      Height:        PHYSICAL EXAM General: Pleasant well-appearing middle-aged female, well nourished, in no acute distress.  Seen standing at the sink washing her face, ambulatory without assistance in the room.  Daughter at bedside. HEENT:  Normocephalic and atraumatic. Neck:  No JVD.  Lungs: Normal respiratory effort on room air. Clear bilaterally to auscultation. No wheezes, crackles, rhonchi.  Heart: HRRR . Normal S1 and S2 without  gallops or murmurs.  Abdomen: Non-distended appearing.  Msk: Normal strength and tone for age. Extremities: Warm and well perfused. No clubbing, cyanosis.  No peripheral edema.  Neuro: Alert and oriented X 3. Psych:  Answers questions appropriately.   Labs: Basic Metabolic Panel: Recent Labs    07/03/23 1508  NA 139  K 3.9  CL 103  CO2 26  GLUCOSE 83  BUN 16  CREATININE 1.00  CALCIUM 9.0   Liver Function Tests: No results for input(s): "AST", "ALT", "ALKPHOS", "BILITOT", "PROT", "ALBUMIN" in the last 72 hours. No results for input(s): "LIPASE", "AMYLASE" in the last 72 hours. CBC: Recent Labs    07/03/23 1508  WBC 10.2  HGB 13.5  HCT 41.1  MCV 90.7  PLT 283   Cardiac Enzymes: Recent Labs    07/03/23 1759 07/03/23 2300 07/04/23 0054  TROPONINIHS 3 2 <2   BNP: No results for input(s): "BNP" in the last 72 hours. D-Dimer: Recent Labs    07/03/23 1508  DDIMER 0.43   Hemoglobin A1C: No results for input(s): "HGBA1C" in the last 72 hours. Fasting Lipid Panel: Recent Labs    07/04/23 0054  CHOL 188  HDL 46  LDLCALC 100*  TRIG 212*  CHOLHDL 4.1   Thyroid Function Tests: No results for input(s): "TSH", "T4TOTAL", "T3FREE", "THYROIDAB" in the last 72 hours.  Invalid input(s): "FREET3" Anemia Panel: No results for input(s): "VITAMINB12", "FOLATE", "FERRITIN", "TIBC", "IRON", "RETICCTPCT" in the last 72 hours.   Radiology: DG Chest 2 View  Result Date:  07/03/2023 CLINICAL DATA:  Chest pain. EXAM: CHEST - 2 VIEW COMPARISON:  December 28, 2020. FINDINGS: The heart size and mediastinal contours are within normal limits. Both lungs are clear. The visualized skeletal structures are unremarkable. IMPRESSION: No active cardiopulmonary disease. Electronically Signed   By: Lupita Raider M.D.   On: 07/03/2023 15:55    ECHO 08/01/2023 STRESS ECHOCARDIOGRAPHY                  Drugs: None              Target HR: 142 bpm           Maximum Predicted HR: 167 bpm  +----------------------------------------------------------------------------+  :Stage          Duration                     HR         BP                  :  :   RESTING     :                            :73        :145/90              :  :---------------+----------------------------+----------+---------+          :  :  EXERCISE     :7:35                        :210       :/                   :  :---------------:----------------------------:----------:---------+          :  :  RECOVERY     :6:11                        :  108       :179/91              :  :---------------+----------------------------+----------+---------+          :  +----------------------------------------------------------------------------+       Stress Duration: 7: 35 mm: ss        Max Stress H.R.: 210 bpm             Target HR Achieved: Yes  _________________________________________________________________________________________  WALL SEGMENT CHANGES                         Rest                Stress  Anterior Septum Basal: Normal              Hyperkinetic                    Mid: Normal              Hyperkinetic                 Apical: Normal              Hyperkinetic    Anterior Wall Basal: Normal              Hyperkinetic                    Mid: Normal              Hyperkinetic                 Apical: Normal              Hyperkinetic     Lateral Wall Basal: Normal              Hyperkinetic                    Mid: Normal               Hyperkinetic                 Apical: Normal              Hyperkinetic   Posterior Wall Basal: Normal              Hyperkinetic                    Mid: Normal              Hyperkinetic    Inferior Wall Basal: Normal              Hyperkinetic                    Mid: Normal              Hyperkinetic                 Apical: Normal              Hyperkinetic  Inferior Septum Basal: Normal              Hyperkinetic                    Mid: Normal              Hyperkinetic             Resting EF: >55% (Est.)  Stress EF: >55% (Est.)   _________________________________________________________________________________________  ADDITIONAL FINDINGS  _________________________________________________________________________________________  STRESS ECG RESULTS            ECG Results: Normal  _____________________________________________________________________ ECHOCARDIOGRAPHIC DESCRIPTIONS  LEFT VENTRICLE                   Size: Normal            Contraction: Normal              LV Masses: No Masses                    LVH: None           Dias.FxClass: Normal  RIGHT VENTRICLE                   Size: Normal                       Free Wall: Normal            Contraction: Normal                       RV Masses: No mass  PERICARDIUM                 Fluid: No effusion  _____________________________________________________________________ DOPPLER ECHO and OTHER SPECIAL PROCEDURES                 Aortic: No AR                      No AS                 Mitral: TRIVIAL MR                 No MS                         MV Inflow E Vel = nm*      MV Annulus E'Vel = nm*                         E/E'Ratio = nm*              Tricuspid: MILD TR                    No TS                         260.6 cm/sec peak TR vel   32.2 mmHg peak RV pressure              Pulmonary: TRIVIAL PR                 No PS   _________________________________________________________________________________________  ECHOCARDIOGRAPHIC MEASUREMENTS  2D DIMENSIONS  AORTA                  Values   Normal Range   MAIN PA         Values    Normal Range                Annulus: nm*     [2.1-2.5]              PA Main: nm*       [1.5-2.1]              Aorta Sin:  nm*     [2.7-3.3]    RIGHT VENTRICLE            ST Junction: nm*     [2.3-2.9]              RV Base: nm*       [<4.2]             Asc.Aorta: nm*     [2.3-3.1]               RV Mid: nm*       [<3.5]  LEFT VENTRICLE                                      RV Length: nm*       [<8.6]                 LVIDd: nm*     [3.9-5.3]    INFERIOR VENA CAVA                  LVIDs: nm*                           Max. IVC: nm*       [<=2.1]                    FS: nm*     [>25]                 Min. IVC: nm*                    SWT: nm*     [0.5-0.9]    ------------------                    PWT: nm*     [0.5-0.9]    nm* - not measured  LEFT ATRIUM                LA Diam: nm*     [2.7-3.8]            LA A4C Area: nm*     [<20]             LA Volume: nm*     [22-52]  _________________________________________________________________________________________  INTERPRETATION  Normal Stress Echocardiogram  NORMAL RIGHT VENTRICULAR SYSTOLIC FUNCTION  MILD VALVULAR REGURGITATION (See above)  NO VALVULAR STENOSIS NOTED   Electronically signed by      MD Arnoldo Hooker on 08/01/2021 12: 55 PM           Performed By: Luretha Murphy, RDCS, RVT     Ordering Physician: Gwen Pounds, MD Mikle Bosworth reviewed by me (LT) 07/04/2023 : Sinus rhythm rate 60s to 80s with intermittent LBBB  EKG reviewed by me: NSR 77 LBBB, repeat NSR 67 anterior TWI with incomplete LBBB   Data reviewed by me (LT) 07/04/2023: ED note, admission H&P last 24h vitals tele labs imaging I/O   Principal Problem:   Chest pain Active Problems:   Anxiety   Essential hypertension   Hypothyroidism   GERD without  esophagitis   Migraine    ASSESSMENT AND PLAN:  Alencia Gordon is a 57yoF with a PMH of HTN, hypothyroidism, OSA (noncompliant with CPAP), normal stress echo (2022) who presented to [facility] ED 07/03/2023 from local urgent care with an chest pain and new intermittent LBBB detected  by her Lourena Simmonds mobile device and confirmed by twelve-lead EKG.  Cardiology is consulted for further assistance.  # Chest pain # Intermittent LBBB Waxing and waning central chest pressure with radiation to left shoulder/upper back worsened with exertion sometimes self terminating/better with rest.  EKGs of demonstrate sinus rhythm with an LBBB, and repeat shows sinus rhythm with normal QRS duration with anterior TWI, new compared to prior from 2022, in 2014.  Reassuringly negative troponins on multiple repeats, and a normal stress echo 2 years ago, but with new conduction delay plus chest discomfort warrant further evaluation with a Lexiscan Myoview. -Continue aspirin 81 mg daily -risk factor modification with lipid panel, A1C.  - add metoprolol XL 12.5mg  daily - lexiscan myoview today, further recommendations based on results.   # HTN Does not typically check BP at home.  On amlodipine was daily, adding metoprolol succinate 12.5 mg as above.  # OSA  Sleep study years ago with recommendations to wear a CPAP, patient declined 2/2 cost and dislike of the mask. Encouraged compliance and revisiting this with PCP.   This patient's plan of care was discussed and created with Dr. Darrold Junker and he is in agreement.  Signed: Rebeca Allegra , PA-C 07/04/2023, 8:26 AM [facility]
